# Patient Record
Sex: Male | Born: 1975 | Race: White | Hispanic: No | Marital: Married | Smoking: Never smoker
Health system: Southern US, Community
[De-identification: ages and names within clinical notes are randomized; demographics above are authoritative.]

## PROBLEM LIST (undated history)

## (undated) DIAGNOSIS — T8859XA Other complications of anesthesia, initial encounter: Secondary | ICD-10-CM

## (undated) DIAGNOSIS — G4733 Obstructive sleep apnea (adult) (pediatric): Secondary | ICD-10-CM

## (undated) DIAGNOSIS — E109 Type 1 diabetes mellitus without complications: Secondary | ICD-10-CM

## (undated) DIAGNOSIS — Z973 Presence of spectacles and contact lenses: Secondary | ICD-10-CM

## (undated) DIAGNOSIS — G4730 Sleep apnea, unspecified: Secondary | ICD-10-CM

## (undated) HISTORY — PX: CYST REMOVAL TRUNK: SHX6283

---

## 1998-05-02 HISTORY — PX: CYST REMOVAL TRUNK: SHX6283

## 2007-01-28 ENCOUNTER — Ambulatory Visit: Payer: Self-pay | Admitting: Family Medicine

## 2007-02-18 ENCOUNTER — Ambulatory Visit: Payer: Self-pay | Admitting: Family Medicine

## 2009-04-21 ENCOUNTER — Ambulatory Visit: Payer: Self-pay | Admitting: Ophthalmology

## 2011-04-01 ENCOUNTER — Ambulatory Visit: Payer: Self-pay | Admitting: Specialist

## 2011-11-21 ENCOUNTER — Ambulatory Visit: Payer: Self-pay | Admitting: Emergency Medicine

## 2011-11-22 DIAGNOSIS — G4733 Obstructive sleep apnea (adult) (pediatric): Secondary | ICD-10-CM | POA: Insufficient documentation

## 2013-03-13 DIAGNOSIS — N529 Male erectile dysfunction, unspecified: Secondary | ICD-10-CM | POA: Insufficient documentation

## 2014-09-26 ENCOUNTER — Other Ambulatory Visit: Payer: Self-pay | Admitting: Family Medicine

## 2014-09-26 DIAGNOSIS — E049 Nontoxic goiter, unspecified: Secondary | ICD-10-CM

## 2014-10-03 ENCOUNTER — Ambulatory Visit
Admission: RE | Admit: 2014-10-03 | Discharge: 2014-10-03 | Disposition: A | Discharge status: Home / Self Care | Payer: BLUE CROSS/BLUE SHIELD | Source: Ambulatory Visit | Attending: Family Medicine | Admitting: Family Medicine

## 2014-10-03 DIAGNOSIS — E041 Nontoxic single thyroid nodule: Secondary | ICD-10-CM | POA: Diagnosis not present

## 2014-10-03 DIAGNOSIS — E049 Nontoxic goiter, unspecified: Secondary | ICD-10-CM

## 2014-10-03 DIAGNOSIS — E010 Iodine-deficiency related diffuse (endemic) goiter: Secondary | ICD-10-CM | POA: Diagnosis present

## 2015-05-03 DIAGNOSIS — E041 Nontoxic single thyroid nodule: Secondary | ICD-10-CM | POA: Insufficient documentation

## 2016-03-30 ENCOUNTER — Other Ambulatory Visit: Payer: Self-pay | Admitting: Family Medicine

## 2016-03-30 DIAGNOSIS — E041 Nontoxic single thyroid nodule: Secondary | ICD-10-CM

## 2016-03-31 ENCOUNTER — Ambulatory Visit
Admission: RE | Admit: 2016-03-31 | Discharge: 2016-03-31 | Disposition: A | Discharge status: Home / Self Care | Payer: 59 | Source: Ambulatory Visit | Attending: Family Medicine | Admitting: Family Medicine

## 2016-03-31 DIAGNOSIS — E041 Nontoxic single thyroid nodule: Secondary | ICD-10-CM

## 2016-04-01 ENCOUNTER — Ambulatory Visit
Admission: EM | Admit: 2016-04-01 | Discharge: 2016-04-01 | Disposition: A | Discharge status: Home / Self Care | Payer: 59 | Attending: Family Medicine | Admitting: Family Medicine

## 2016-04-01 DIAGNOSIS — J069 Acute upper respiratory infection, unspecified: Secondary | ICD-10-CM | POA: Diagnosis not present

## 2016-04-01 HISTORY — DX: Type 1 diabetes mellitus without complications: E10.9

## 2016-04-01 MED ORDER — HYDROCOD POLST-CPM POLST ER 10-8 MG/5ML PO SUER: 5.0000 mL | Freq: Every evening | ORAL | 0 refills | Status: DC | PRN

## 2016-04-01 MED ORDER — BENZONATATE 100 MG PO CAPS: 100.0000 mg | ORAL_CAPSULE | Freq: Three times a day (TID) | ORAL | 0 refills | Status: DC | PRN

## 2016-04-01 NOTE — ED Triage Notes (Signed)
Patient complains of cough x 3 days. Patient states that he has also had some hoarseness. Patient states that symptoms worsened last night.

## 2016-04-01 NOTE — ED Provider Notes (Signed)
[facility] ____________________________________________  Time seen: Approximately 8:25 AM  I have reviewed the triage vital signs and the nursing notes.   HISTORY  Chief Complaint Cough   HPI Darius Morrison is a 40 y.o. male presents with complaints of 3 days of cough and congestion. Patient reports biggest complaint is a dry hacking cough. Patient reports some nasal congestion and chest congestion. States the cough is waking him up at night. Denies sore throat. Reports weight Wife and daughter recently at home with similar. Denies fevers. Reports continues to eat and drink well. Reports symptoms have been unresolved with over-the-counter Mucinex DM and Delsym. Denies chest pain, shortness of breath, abdominal pain, dysuria, fevers, dizziness, weakness. Denies recent sickness or recent antibiotic use. Reports type one diabetic, and reports sugars have been well controlled.  [name], Darius Ernesto, MD: PCP   Past Medical History:  Diagnosis Date  . Diabetes type 1, controlled (HCC)     There are no active problems to display for this patient.   Past Surgical History:  Procedure Laterality Date  . CYST REMOVAL TRUNK      Current Outpatient Rx  . Order #: [id number] Class: Historical Med  . Order #: [id number] Class: Historical Med  . Order #: [id number] Class: Historical Med  . Order #: [id number] Class: Historical Med  . Order #: [id number] Class: Normal  . Order #: [id number] Class: Print    No current facility-administered medications for this encounter.   Current Outpatient Prescriptions:  .  atorvastatin (LIPITOR) 10 MG tablet, Take 10 mg by mouth daily., Disp: , Rfl:  .  insulin glargine (LANTUS) 100 UNIT/ML injection, Inject 30 Units into the skin at bedtime., Disp: , Rfl:  .  insulin lispro (HUMALOG) 100 UNIT/ML injection, Inject 18 Units into the skin 3 (three) times daily with meals., Disp: , Rfl:  .  lisinopril (PRINIVIL,ZESTRIL) 5 MG tablet, Take 5 mg by  mouth daily., Disp: , Rfl:  .  benzonatate (TESSALON PERLES) 100 MG capsule, Take 1 capsule (100 mg total) by mouth 3 (three) times daily as needed., Disp: 15 capsule, Rfl: 0 .  chlorpheniramine-HYDROcodone (TUSSIONEX PENNKINETIC ER) 10-8 MG/5ML SUER, Take 5 mLs by mouth at bedtime as needed. do not drive or operate machinery while taking as can cause drowsiness., Disp: 100 mL, Rfl: 0  Allergies Patient has no known allergies.  History reviewed. No pertinent family history.  Social History Social History  Substance Use Topics  . Smoking status: Never Smoker  . Smokeless tobacco: Never Used  . Alcohol use Yes     Comment: rarely    Review of Systems Constitutional: No fever/chills Eyes: No visual changes. ENT: No sore throat. Cardiovascular: Denies chest pain. Respiratory: Denies shortness of breath. Gastrointestinal: No abdominal pain.  No nausea, no vomiting.  No diarrhea.  No constipation. Genitourinary: Negative for dysuria. Musculoskeletal: Negative for back pain. Skin: Negative for rash. Neurological: Negative for headaches, focal weakness or numbness.  10-point ROS otherwise negative.  ____________________________________________   PHYSICAL EXAM:  VITAL SIGNS: ED Triage Vitals  Enc Vitals Group     BP 04/01/16 0817 123/77     Pulse Rate 04/01/16 0817 87     Resp 04/01/16 0817 16     Temp 04/01/16 0817 98.2 F (36.8 C)     Temp Source 04/01/16 0817 Oral     SpO2 04/01/16 0817 98 %     Weight 04/01/16 0816 250 lb (113.4 kg)     Height 04/01/16 0816 6\' 1"  (1.854 m)  Head Circumference --      Peak Flow --      Pain Score 04/01/16 0818 1     Pain Loc --      Pain Edu? --      Excl. in GC? --    Constitutional: Alert and oriented. Well appearing and in no acute distress. Eyes: Conjunctivae are normal. PERRL. EOMI. Head: Atraumatic. No sinus tenderness to palpation. No swelling. No erythema.  Ears: no erythema, normal TMs bilaterally.   Nose:Nasal  congestion with clear rhinorrhea  Mouth/Throat: Mucous membranes are moist. Mild pharyngeal erythema. No tonsillar swelling or exudate.  Neck: No stridor.  No cervical spine tenderness to palpation. Hematological/Lymphatic/Immunilogical: No cervical lymphadenopathy. Cardiovascular: Normal rate, regular rhythm. Grossly normal heart sounds.  Good peripheral circulation. Respiratory: Normal respiratory effort.  No retractions. Lungs CTAB.No wheezes, rales or rhonchi. Good air movement.  Gastrointestinal: Soft and nontender.  Musculoskeletal:Ambulatory with steady gait. No cervical, thoracic or lumbar tenderness to palpation. Neurologic:  Normal speech and language. No gross focal neurologic deficits are appreciated. No gait instability. Skin:  Skin is warm, dry and intact. No rash noted. Psychiatric: Mood and affect are normal. Speech and behavior are normal.  ___________________________________________   LABS (all labs ordered are listed, but only abnormal results are displayed)  Labs Reviewed - No data to display ____________________________________________   PROCEDURES Procedures     INITIAL IMPRESSION / ASSESSMENT AND PLAN / ED COURSE  Pertinent labs & imaging results that were available during my care of the patient were reviewed by me and considered in my medical decision making (see chart for details).  Well-appearing patient. No acute distress. Suspect viral upper respiratory infection. Will treat supportively. Will treat with when necessary Tessalon Perles and when necessary Tussionex at night as needed for cough. Encouraged rest, fluids and PCP follow-up as needed.  Discussed follow up with Primary care physician this week. Discussed follow up and return parameters including no resolution or any worsening concerns. Patient verbalized understanding and agreed to plan.   ____________________________________________   FINAL CLINICAL IMPRESSION(S) / ED DIAGNOSES  Final  diagnoses:  Upper respiratory tract infection, unspecified type     Discharge Medication List as of 04/01/2016  8:32 AM    START taking these medications   Details  benzonatate (TESSALON PERLES) 100 MG capsule Take 1 capsule (100 mg total) by mouth 3 (three) times daily as needed., Starting Fri 04/01/2016, Normal    chlorpheniramine-HYDROcodone (TUSSIONEX PENNKINETIC ER) 10-8 MG/5ML SUER Take 5 mLs by mouth at bedtime as needed. do not drive or operate machinery while taking as can cause drowsiness., Starting Fri 04/01/2016, Print        Note: This dictation was prepared with Dragon dictation along with smaller phrase technology. Any transcriptional errors that result from this process are unintentional.    Clinical Course       Renford DillsLindsey Cynthya Yam, NP 04/01/16 [phone]

## 2016-04-01 NOTE — Discharge Instructions (Signed)
Take medication as prescribed. Rest. Drink plenty of fluids.  ° °Follow up with your primary care physician this week as needed. Return to Urgent care for new or worsening concerns.  ° °

## 2017-02-28 ENCOUNTER — Ambulatory Visit
Admission: EM | Admit: 2017-02-28 | Discharge: 2017-02-28 | Disposition: A | Discharge status: Home / Self Care | Payer: BLUE CROSS/BLUE SHIELD | Attending: Family Medicine | Admitting: Family Medicine

## 2017-02-28 DIAGNOSIS — S0502XA Injury of conjunctiva and corneal abrasion without foreign body, left eye, initial encounter: Secondary | ICD-10-CM

## 2017-02-28 MED ORDER — MOXIFLOXACIN HCL 0.5 % OP SOLN: 1.0000 [drp] | Freq: Three times a day (TID) | OPHTHALMIC | 0 refills | Status: DC

## 2017-02-28 NOTE — ED Triage Notes (Addendum)
Pt started with left eye redness and some irritation. Today was "matted shut". Pt reports he is just getting over a cold

## 2017-02-28 NOTE — ED Provider Notes (Signed)
[facility]    CSN: [id number] Arrival date & time: 02/28/17  0801     History   Chief Complaint Chief Complaint  Patient presents with  . Conjunctivitis    HPI Darius Morrison is a 41 y.o. male.   The history is provided by the patient.  Eye Problem  Location:  Left eye Quality:  Aching and foreign body sensation Severity:  Moderate Onset quality:  Sudden Duration:  1 day Timing:  Constant Progression:  Worsening Chronicity:  New Context: foreign body (states he feels he may have gotten some dirt or something in the eye yesterday )   Foreign body:  Dirt Relieved by:  Nothing Ineffective treatments:  Flushing Associated symptoms: discharge (mild, this morning), photophobia, redness and tearing   Associated symptoms: no blurred vision, no crusting, no decreased vision, no facial rash, no inflammation, no itching, no nausea, no numbness, no scotomas, no swelling, no tingling and no vomiting   Risk factors: no exposure to pinkeye and no previous injury to eye     Past Medical History:  Diagnosis Date  . Diabetes type 1, controlled (HCC)     There are no active problems to display for this patient.   Past Surgical History:  Procedure Laterality Date  . CYST REMOVAL TRUNK         Home Medications    Prior to Admission medications   Medication Sig Start Date End Date Taking? Authorizing Provider  insulin aspart (NOVOLOG) 100 UNIT/ML injection Inject into the skin 3 (three) times daily before meals.   Yes [provider]  atorvastatin (LIPITOR) 10 MG tablet Take 10 mg by mouth daily.    [provider]  benzonatate (TESSALON PERLES) 100 MG capsule Take 1 capsule (100 mg total) by mouth 3 (three) times daily as needed. 04/01/16   Renford DillsMiller, Lindsey, NP  chlorpheniramine-HYDROcodone [facility](TUSSIONEX PENNKINETIC ER) 10-8 MG/5ML SUER Take 5 mLs by mouth at bedtime as needed. do not drive or operate machinery while taking as can cause drowsiness.  04/01/16   Renford DillsMiller, Lindsey, NP  insulin glargine (LANTUS) 100 UNIT/ML injection Inject 30 Units into the skin at bedtime.    [provider]  insulin lispro (HUMALOG) 100 UNIT/ML injection Inject 18 Units into the skin 3 (three) times daily with meals.    [provider]  lisinopril (PRINIVIL,ZESTRIL) 5 MG tablet Take 5 mg by mouth daily.    [provider]  moxifloxacin (VIGAMOX) 0.5 % ophthalmic solution Place 1 drop into the left eye 3 (three) times daily. 02/28/17   Payton Mccallumonty, Suella Cogar, MD    Family History History reviewed. No pertinent family history.  Social History Social History  Substance Use Topics  . Smoking status: Never Smoker  . Smokeless tobacco: Never Used  . Alcohol use Yes     Comment: rarely     Allergies   Patient has no known allergies.   Review of Systems Review of Systems  Eyes: Positive for photophobia, discharge (mild, this morning) and redness. Negative for blurred vision and itching.  Gastrointestinal: Negative for nausea and vomiting.  Neurological: Negative for tingling and numbness.     Physical Exam Triage Vital Signs ED Triage Vitals  Enc Vitals Group     BP 02/28/17 0815 128/90     Pulse Rate 02/28/17 0815 74     Resp 02/28/17 0815 18     Temp 02/28/17 0815 98.1 F (36.7 C)     Temp Source 02/28/17 0815 Oral  SpO2 02/28/17 0815 99 %     Weight 02/28/17 0816 250 lb (113.4 kg)     Height 02/28/17 0816 6\' 1"  (1.854 m)     Head Circumference --      Peak Flow --      Pain Score 02/28/17 0817 3     Pain Loc --      Pain Edu? --      Excl. in GC? --    No data found.   Updated Vital Signs BP 128/90 (BP Location: Left Arm)   Pulse 74   Temp 98.1 F (36.7 C) (Oral)   Resp 18   Ht 6\' 1"  (1.854 m)   Wt 250 lb (113.4 kg)   SpO2 99%   BMI 32.98 kg/m   Visual Acuity Right Eye Distance:  (20/20 corrected) Left Eye Distance:  (20/15 corrected) Bilateral Distance:  (20/15 corrected)  Right Eye Near:     Left Eye Near:    Bilateral Near:     Physical Exam  Constitutional: He appears well-developed and well-nourished. No distress.  Eyes: Lids are normal. Lids are everted and swept, no foreign bodies found. Left conjunctiva is injected.  Fundoscopic exam:      The left eye shows no exudate and no hemorrhage.  Slit lamp exam:      The left eye shows corneal abrasion and fluorescein uptake. The left eye shows no foreign body.    Skin: He is not diaphoretic.  Nursing note and vitals reviewed.    UC Treatments / Results  Labs (all labs ordered are listed, but only abnormal results are displayed) Labs Reviewed - No data to display  EKG  EKG Interpretation None       Radiology No results found.  Procedures Procedures (including critical care time)  Medications Ordered in UC Medications - No data to display   Initial Impression / Assessment and Plan / UC Course  I have reviewed the triage vital signs and the nursing notes.  Pertinent labs & imaging results that were available during my care of the patient were reviewed by me and considered in my medical decision making (see chart for details).       Final Clinical Impressions(s) / UC Diagnoses   Final diagnoses:  Abrasion of left cornea, initial encounter    New Prescriptions Discharge Medication List as of 02/28/2017  8:32 AM    START taking these medications   Details  moxifloxacin (VIGAMOX) 0.5 % ophthalmic solution Place 1 drop into the left eye 3 (three) times daily., Starting Tue 02/28/2017, Normal       1. diagnosis reviewed with patient 2. rx as per orders above; reviewed possible side effects, interactions, risks and benefits  3. Recommend supportive treatment with cool compresses prn; otc analgesics prn; don't wear contact lenses until resolved 4. Follow-up prn if symptoms worsen or don't improve Controlled Substance Prescriptions [place] Controlled Substance Registry consulted? Not Applicable    Payton Mccallum, MD 02/28/17 [phone]

## 2017-03-18 ENCOUNTER — Other Ambulatory Visit: Payer: Self-pay

## 2017-03-18 ENCOUNTER — Encounter: Payer: Self-pay | Admitting: Emergency Medicine

## 2017-03-18 ENCOUNTER — Ambulatory Visit
Admission: EM | Admit: 2017-03-18 | Discharge: 2017-03-18 | Disposition: A | Discharge status: Home / Self Care | Payer: BLUE CROSS/BLUE SHIELD | Attending: Emergency Medicine | Admitting: Emergency Medicine

## 2017-03-18 ENCOUNTER — Ambulatory Visit (INDEPENDENT_AMBULATORY_CARE_PROVIDER_SITE_OTHER): Payer: BLUE CROSS/BLUE SHIELD

## 2017-03-18 DIAGNOSIS — J019 Acute sinusitis, unspecified: Secondary | ICD-10-CM | POA: Diagnosis not present

## 2017-03-18 DIAGNOSIS — R059 Cough, unspecified: Secondary | ICD-10-CM

## 2017-03-18 DIAGNOSIS — R05 Cough: Secondary | ICD-10-CM

## 2017-03-18 DIAGNOSIS — R0602 Shortness of breath: Secondary | ICD-10-CM

## 2017-03-18 DIAGNOSIS — R062 Wheezing: Secondary | ICD-10-CM

## 2017-03-18 DIAGNOSIS — R0600 Dyspnea, unspecified: Secondary | ICD-10-CM

## 2017-03-18 MED ORDER — BENZONATATE 100 MG PO CAPS: 100.0000 mg | ORAL_CAPSULE | Freq: Three times a day (TID) | ORAL | 0 refills | Status: DC | PRN

## 2017-03-18 MED ORDER — AEROCHAMBER PLUS MISC: 2 refills | Status: DC

## 2017-03-18 MED ORDER — DOXYCYCLINE HYCLATE 100 MG PO CAPS: 100.0000 mg | ORAL_CAPSULE | Freq: Two times a day (BID) | ORAL | 0 refills | Status: AC

## 2017-03-18 MED ORDER — ALBUTEROL SULFATE HFA 108 (90 BASE) MCG/ACT IN AERS: 1.0000 | INHALATION_SPRAY | Freq: Four times a day (QID) | RESPIRATORY_TRACT | 0 refills | Status: DC | PRN

## 2017-03-18 MED ORDER — FLUTICASONE PROPIONATE 50 MCG/ACT NA SUSP: 2.0000 | Freq: Every day | NASAL | 0 refills | Status: DC

## 2017-03-18 MED ORDER — IPRATROPIUM-ALBUTEROL 0.5-2.5 (3) MG/3ML IN SOLN
3.0000 mL | Freq: Once | RESPIRATORY_TRACT | Status: AC
Start: 2017-03-18 — End: 2017-03-18
  Administered 2017-03-18: 3 mL via RESPIRATORY_TRACT

## 2017-03-18 NOTE — Discharge Instructions (Signed)
2 puffs from your albuterol inhaler every 4- 6 hours.  Try some Claritin, Zyrtec, or Allegra.  Start the Flonase and saline nasal irrigation.

## 2017-03-18 NOTE — ED Provider Notes (Signed)
HPI  SUBJECTIVE:  Darius Morrison is a 41 y.o. male who presents with "bronchitis" for the past month.  He wants to rule out pneumonia today.  States that he got better for a day or 2 and then got worse again.  He reports nasal congestion, rhinorrhea, postnasal drip, head and chest congestion, sinus pressure, dry cough, wheezing, shortness of breath described as "breathing through a wet sock".  He states that his coughing is worse in the morning with lying down, and he reports sneezing and itchy eyes.  Tried Mucinex and DayQuil, Tylenol, ibuprofen.  Mucinex helps.  No aggravating factors.  No sinus pain, upper dental pain, fevers, GERD symptoms.  No chest pain.  No antibiotic in the past month.  No antipyretic in the past 6-8 hours.  No lower extremity edema, dyspnea on exertion, nocturia, abdominal pain, weight gain, PND, orthopnea.  He has a past medical history of OSA on CPAP, diabetes type 1.  He is on lisinopril to protect his kidneys does not have hypertension.  No history of known allergies, sinusitis, asthma, eczema, COPD, smoking, CHF, pneumonia.  PMD: [name], Mario Ernesto, MD    Past Medical History:  Diagnosis Date  . Diabetes type 1, controlled (HCC)     Past Surgical History:  Procedure Laterality Date  . CYST REMOVAL TRUNK      Family History  Problem Relation Age of Onset  . Thyroid disease Mother     Social History   Tobacco Use  . Smoking status: Never Smoker  . Smokeless tobacco: Never Used  Substance Use Topics  . Alcohol use: Yes    Comment: rarely  . Drug use: No    No current facility-administered medications for this encounter.   Current Outpatient Medications:  .  albuterol (PROVENTIL HFA;VENTOLIN HFA) 108 (90 Base) MCG/ACT inhaler, Inhale 1-2 puffs every 6 (six) hours as needed into the lungs for wheezing or shortness of breath., Disp: 1 Inhaler, Rfl: 0 .  atorvastatin (LIPITOR) 10 MG tablet, Take 10 mg by mouth daily., Disp: , Rfl:  .  benzonatate  (TESSALON PERLES) 100 MG capsule, Take 1 capsule (100 mg total) 3 (three) times daily as needed by mouth., Disp: 30 capsule, Rfl: 0 .  doxycycline (VIBRAMYCIN) 100 MG capsule, Take 1 capsule (100 mg total) 2 (two) times daily for 7 days by mouth., Disp: 14 capsule, Rfl: 0 .  fluticasone (FLONASE) 50 MCG/ACT nasal spray, Place 2 sprays daily into both nostrils., Disp: 16 g, Rfl: 0 .  insulin aspart (NOVOLOG) 100 UNIT/ML injection, Inject into the skin 3 (three) times daily before meals., Disp: , Rfl:  .  insulin glargine (LANTUS) 100 UNIT/ML injection, Inject 30 Units into the skin at bedtime., Disp: , Rfl:  .  insulin lispro (HUMALOG) 100 UNIT/ML injection, Inject 18 Units into the skin 3 (three) times daily with meals., Disp: , Rfl:  .  lisinopril (PRINIVIL,ZESTRIL) 5 MG tablet, Take 5 mg by mouth daily., Disp: , Rfl:  .  moxifloxacin (VIGAMOX) 0.5 % ophthalmic solution, Place 1 drop into the left eye 3 (three) times daily., Disp: 3 mL, Rfl: 0 .  Spacer/Aero-Holding Chambers (AEROCHAMBER PLUS) inhaler, Use as instructed, Disp: 1 each, Rfl: 2  No Known Allergies   ROS  As noted in HPI.   Physical Exam  BP 121/82 (BP Location: Left Arm)   Pulse 100   Temp 98.6 F (37 C) (Oral)   Resp 16   Ht 6\' 1"  (1.854 m)   Wt  258 lb 6.4 oz (117.2 kg)   SpO2 99%   BMI 34.09 kg/m   Constitutional: Well developed, well nourished, no acute distress Eyes:  EOMI, conjunctiva normal bilaterally HENT: Normocephalic, atraumatic,mucus membranes moist and positive clear rhinorrhea.  Erythematous, swollen turbinates.  No sinus tenderness.  No postnasal drip or cobblestoning. Respiratory: Normal inspiratory effort clear bilaterally.  No chest wall tenderness  cardiovascular: Normal rate regular rhythm no murmur or gallops  GI: nondistended skin: No rash, skin intact Musculoskeletal: no deformities, calves symmetric, nontender, no edema neurologic: Alert & oriented x 3, no focal neuro deficits Psychiatric:  Speech and behavior appropriate   ED Course   Medications  ipratropium-albuterol (DUONEB) 0.5-2.5 (3) MG/3ML nebulizer solution 3 mL (3 mLs Nebulization Given 03/18/17 0830)    Orders Placed This Encounter  Procedures  . DG Chest 2 View    Standing Status:   Standing    Number of Occurrences:   1    Order Specific Question:   Reason for Exam (SYMPTOM  OR DIAGNOSIS REQUIRED)    Answer:   COUGH X  MONTH R/O pna pulm edema effusion mass    No results found for this or any previous visit (from the past 24 hour(s)). Dg Chest 2 View  Result Date: 03/18/2017 CLINICAL DATA:  Difficulty breathing, cough, congestion EXAM: CHEST  2 VIEW COMPARISON:  None. FINDINGS: Heart and mediastinal contours are within normal limits. No focal opacities or effusions. No acute bony abnormality. IMPRESSION: No active cardiopulmonary disease. Electronically Signed   By: Charlett Nose M.D.   On: 03/18/2017 09:10    ED Clinical Impression  Cough  Acute non-recurrent sinusitis, unspecified location   ED Assessment/Plan  We will give DuoNeb for possible bronchospasm, check chest x-ray given duration of symptoms.  Will reevaluate.  Reviewed imaging independently.  Normal chest x-ray.  See radiology report for full details. On reexamination, patient states that he feels better.  States his breathing has improved.  Lungs still clear on repeat exam.  Presentation consistent with cough from postnasal drip some bronchospasm.  Think that this is related to allergies given duration of symptoms and the itchy, watery eyes.  He does report sinus pain and pressure but he has no sinus tenderness.  Will try Tessalon, albuterol with a spacer, Flonase, and antihistamine of his choice such as Zyrtec, and if he does not get better with these, a wait-and-see prescription of doxycycline.  Follow up with PMD as needed, to the ER if he gets worse. Discussed imaging, MDM, plan and followup with patient. Discussed sn/sx that should  prompt return to the ED. patient agrees with plan.   Meds ordered this encounter  Medications  . ipratropium-albuterol (DUONEB) 0.5-2.5 (3) MG/3ML nebulizer solution 3 mL  . benzonatate (TESSALON PERLES) 100 MG capsule    Sig: Take 1 capsule (100 mg total) 3 (three) times daily as needed by mouth.    Dispense:  30 capsule    Refill:  0  . albuterol (PROVENTIL HFA;VENTOLIN HFA) 108 (90 Base) MCG/ACT inhaler    Sig: Inhale 1-2 puffs every 6 (six) hours as needed into the lungs for wheezing or shortness of breath.    Dispense:  1 Inhaler    Refill:  0  . Spacer/Aero-Holding Chambers (AEROCHAMBER PLUS) inhaler    Sig: Use as instructed    Dispense:  1 each    Refill:  2  . fluticasone (FLONASE) 50 MCG/ACT nasal spray    Sig: Place 2 sprays daily into both  nostrils.    Dispense:  16 g    Refill:  0  . doxycycline (VIBRAMYCIN) 100 MG capsule    Sig: Take 1 capsule (100 mg total) 2 (two) times daily for 7 days by mouth.    Dispense:  14 capsule    Refill:  0    *This clinic note was created using Scientist, clinical (histocompatibility and immunogenetics)Dragon dictation software. Therefore, there may be occasional mistakes despite careful proofreading.   ?    Domenick GongMortenson, Pamela Intrieri, MD 03/18/17 1310

## 2017-03-18 NOTE — ED Triage Notes (Signed)
Patient c/o cough and chest congestion off and on for a month.  Patient denies fevers.  Patient also reports runny nose.

## 2017-09-26 DIAGNOSIS — E785 Hyperlipidemia, unspecified: Secondary | ICD-10-CM | POA: Insufficient documentation

## 2017-09-26 DIAGNOSIS — E109 Type 1 diabetes mellitus without complications: Secondary | ICD-10-CM | POA: Insufficient documentation

## 2018-01-04 DIAGNOSIS — E785 Hyperlipidemia, unspecified: Secondary | ICD-10-CM | POA: Diagnosis not present

## 2018-01-04 DIAGNOSIS — E109 Type 1 diabetes mellitus without complications: Secondary | ICD-10-CM | POA: Diagnosis not present

## 2018-01-04 DIAGNOSIS — N528 Other male erectile dysfunction: Secondary | ICD-10-CM | POA: Diagnosis not present

## 2018-01-04 DIAGNOSIS — E875 Hyperkalemia: Secondary | ICD-10-CM | POA: Diagnosis not present

## 2018-02-09 DIAGNOSIS — Z23 Encounter for immunization: Secondary | ICD-10-CM | POA: Diagnosis not present

## 2018-03-28 DIAGNOSIS — M9901 Segmental and somatic dysfunction of cervical region: Secondary | ICD-10-CM | POA: Diagnosis not present

## 2018-03-28 DIAGNOSIS — M9902 Segmental and somatic dysfunction of thoracic region: Secondary | ICD-10-CM | POA: Diagnosis not present

## 2018-03-28 DIAGNOSIS — M546 Pain in thoracic spine: Secondary | ICD-10-CM | POA: Diagnosis not present

## 2018-03-28 DIAGNOSIS — M530 Cervicocranial syndrome: Secondary | ICD-10-CM | POA: Diagnosis not present

## 2018-04-03 DIAGNOSIS — M9902 Segmental and somatic dysfunction of thoracic region: Secondary | ICD-10-CM | POA: Diagnosis not present

## 2018-04-03 DIAGNOSIS — M546 Pain in thoracic spine: Secondary | ICD-10-CM | POA: Diagnosis not present

## 2018-04-03 DIAGNOSIS — M530 Cervicocranial syndrome: Secondary | ICD-10-CM | POA: Diagnosis not present

## 2018-04-03 DIAGNOSIS — M9901 Segmental and somatic dysfunction of cervical region: Secondary | ICD-10-CM | POA: Diagnosis not present

## 2018-04-05 DIAGNOSIS — M9902 Segmental and somatic dysfunction of thoracic region: Secondary | ICD-10-CM | POA: Diagnosis not present

## 2018-04-05 DIAGNOSIS — M546 Pain in thoracic spine: Secondary | ICD-10-CM | POA: Diagnosis not present

## 2018-04-05 DIAGNOSIS — E785 Hyperlipidemia, unspecified: Secondary | ICD-10-CM | POA: Diagnosis not present

## 2018-04-05 DIAGNOSIS — E109 Type 1 diabetes mellitus without complications: Secondary | ICD-10-CM | POA: Diagnosis not present

## 2018-04-05 DIAGNOSIS — M9901 Segmental and somatic dysfunction of cervical region: Secondary | ICD-10-CM | POA: Diagnosis not present

## 2018-04-05 DIAGNOSIS — M530 Cervicocranial syndrome: Secondary | ICD-10-CM | POA: Diagnosis not present

## 2018-04-10 DIAGNOSIS — M9901 Segmental and somatic dysfunction of cervical region: Secondary | ICD-10-CM | POA: Diagnosis not present

## 2018-04-10 DIAGNOSIS — M546 Pain in thoracic spine: Secondary | ICD-10-CM | POA: Diagnosis not present

## 2018-04-10 DIAGNOSIS — M9902 Segmental and somatic dysfunction of thoracic region: Secondary | ICD-10-CM | POA: Diagnosis not present

## 2018-04-10 DIAGNOSIS — M530 Cervicocranial syndrome: Secondary | ICD-10-CM | POA: Diagnosis not present

## 2018-04-12 DIAGNOSIS — M546 Pain in thoracic spine: Secondary | ICD-10-CM | POA: Diagnosis not present

## 2018-04-12 DIAGNOSIS — M530 Cervicocranial syndrome: Secondary | ICD-10-CM | POA: Diagnosis not present

## 2018-04-12 DIAGNOSIS — M9902 Segmental and somatic dysfunction of thoracic region: Secondary | ICD-10-CM | POA: Diagnosis not present

## 2018-04-12 DIAGNOSIS — M9901 Segmental and somatic dysfunction of cervical region: Secondary | ICD-10-CM | POA: Diagnosis not present

## 2018-05-22 DIAGNOSIS — M9901 Segmental and somatic dysfunction of cervical region: Secondary | ICD-10-CM | POA: Diagnosis not present

## 2018-05-22 DIAGNOSIS — M9902 Segmental and somatic dysfunction of thoracic region: Secondary | ICD-10-CM | POA: Diagnosis not present

## 2018-05-22 DIAGNOSIS — M546 Pain in thoracic spine: Secondary | ICD-10-CM | POA: Diagnosis not present

## 2018-05-22 DIAGNOSIS — M530 Cervicocranial syndrome: Secondary | ICD-10-CM | POA: Diagnosis not present

## 2018-06-26 DIAGNOSIS — M9901 Segmental and somatic dysfunction of cervical region: Secondary | ICD-10-CM | POA: Diagnosis not present

## 2018-06-26 DIAGNOSIS — M530 Cervicocranial syndrome: Secondary | ICD-10-CM | POA: Diagnosis not present

## 2018-06-26 DIAGNOSIS — M546 Pain in thoracic spine: Secondary | ICD-10-CM | POA: Diagnosis not present

## 2018-06-26 DIAGNOSIS — M9902 Segmental and somatic dysfunction of thoracic region: Secondary | ICD-10-CM | POA: Diagnosis not present

## 2018-07-05 DIAGNOSIS — E875 Hyperkalemia: Secondary | ICD-10-CM | POA: Diagnosis not present

## 2018-07-05 DIAGNOSIS — G4733 Obstructive sleep apnea (adult) (pediatric): Secondary | ICD-10-CM | POA: Diagnosis not present

## 2018-07-05 DIAGNOSIS — E109 Type 1 diabetes mellitus without complications: Secondary | ICD-10-CM | POA: Diagnosis not present

## 2018-07-05 DIAGNOSIS — E785 Hyperlipidemia, unspecified: Secondary | ICD-10-CM | POA: Diagnosis not present

## 2018-10-10 DIAGNOSIS — E109 Type 1 diabetes mellitus without complications: Secondary | ICD-10-CM | POA: Diagnosis not present

## 2018-10-10 DIAGNOSIS — E785 Hyperlipidemia, unspecified: Secondary | ICD-10-CM | POA: Diagnosis not present

## 2018-10-10 DIAGNOSIS — Z20828 Contact with and (suspected) exposure to other viral communicable diseases: Secondary | ICD-10-CM | POA: Diagnosis not present

## 2018-10-10 LAB — LIPID PANEL
Cholesterol: 138 (ref 0–200)
HDL: 58 (ref 35–70)
LDL Cholesterol: 63
Triglycerides: 86 (ref 40–160)

## 2018-10-10 LAB — HEMOGLOBIN A1C: Hemoglobin A1C: 7.2

## 2018-10-10 LAB — BASIC METABOLIC PANEL
BUN: 10 (ref 4–21)
Creatinine: 1.3 (ref 0.6–1.3)

## 2018-12-11 DIAGNOSIS — J069 Acute upper respiratory infection, unspecified: Secondary | ICD-10-CM | POA: Diagnosis not present

## 2018-12-11 DIAGNOSIS — Z20828 Contact with and (suspected) exposure to other viral communicable diseases: Secondary | ICD-10-CM | POA: Diagnosis not present

## 2019-01-06 ENCOUNTER — Encounter: Payer: Self-pay | Admitting: Internal Medicine

## 2019-01-09 ENCOUNTER — Ambulatory Visit: Payer: BC Managed Care – PPO | Admitting: Internal Medicine

## 2019-01-09 ENCOUNTER — Encounter: Payer: Self-pay | Admitting: Internal Medicine

## 2019-01-09 ENCOUNTER — Other Ambulatory Visit: Payer: Self-pay

## 2019-01-09 VITALS — BP 128/70 | HR 68 | Ht 73.0 in | Wt 262.0 lb

## 2019-01-09 DIAGNOSIS — E109 Type 1 diabetes mellitus without complications: Secondary | ICD-10-CM | POA: Diagnosis not present

## 2019-01-09 DIAGNOSIS — E785 Hyperlipidemia, unspecified: Secondary | ICD-10-CM | POA: Diagnosis not present

## 2019-01-09 DIAGNOSIS — L247 Irritant contact dermatitis due to plants, except food: Secondary | ICD-10-CM | POA: Diagnosis not present

## 2019-01-09 DIAGNOSIS — E041 Nontoxic single thyroid nodule: Secondary | ICD-10-CM

## 2019-01-09 MED ORDER — TRIAMCINOLONE ACETONIDE 0.5 % EX CREA: 1.0000 "application " | TOPICAL_CREAM | Freq: Three times a day (TID) | CUTANEOUS | 1 refills | Status: DC

## 2019-01-09 NOTE — Progress Notes (Signed)
Date:  01/09/2019   Name:  Darius Morrison   DOB:  11/09/1975   MRN:  [id number]   Chief Complaint: Establish Care and Poison Ivy (Right forearm, and right abdomen. X 1 week. Using OTC cortisporin and not helping. )  Diabetes He presents for his follow-up diabetic visit. He has type 1 diabetes mellitus. His disease course has been stable. There are no hypoglycemic associated symptoms. Pertinent negatives for hypoglycemia include no dizziness, headaches, nervousness/anxiousness or tremors. Pertinent negatives for diabetes include no chest pain, no fatigue, no polydipsia and no polyuria. Symptoms are stable. Current diabetic treatment includes intensive insulin program. He is compliant with treatment all of the time. His weight is stable. He monitors blood glucose at home 3-4 x per day. There is no change in his home blood glucose trend. An ACE inhibitor/angiotensin II receptor blocker is being taken.  Hyperlipidemia Pertinent negatives include no chest pain or shortness of breath.  Rash This is a new problem. The current episode started in the past 7 days. The problem is unchanged. The affected locations include the right arm and abdomen. The rash is characterized by blistering and itchiness. He was exposed to plant contact. Pertinent negatives include no cough, fatigue, shortness of breath or sore throat.  Thyroid nodule - this was found about 4 years ago on exam.  Pt denies any symptoms of compression, dysphagia, change in weight.  His last [place] was in 2017. IMPRESSION: 9 mm hypoechoic solid right upper pole thyroid nodule (TR 5), this nodule meets criteria for follow-up in 1 year.  Lab Results  Component Value Date   HGBA1C 7.2 10/10/2018   Lab Results  Component Value Date   CHOL 138 10/10/2018   HDL 58 10/10/2018   LDLCALC 63 10/10/2018   TRIG 86 10/10/2018   Lab Results  Component Value Date   CREATININE 1.3 10/10/2018   BUN 10 10/10/2018     Review of Systems   Constitutional: Negative for appetite change, chills, fatigue and unexpected weight change.  HENT: Negative for sore throat and trouble swallowing.   Eyes: Negative for visual disturbance.  Respiratory: Negative for cough, choking, shortness of breath and wheezing.   Cardiovascular: Negative for chest pain, palpitations and leg swelling.  Gastrointestinal: Negative for abdominal pain and blood in stool.  Endocrine: Negative for polydipsia and polyuria.  Genitourinary: Negative for dysuria and hematuria.  Skin: Positive for rash. Negative for color change.  Allergic/Immunologic: Negative for environmental allergies.  Neurological: Negative for dizziness, tremors, numbness and headaches.  Psychiatric/Behavioral: Negative for dysphoric mood and sleep disturbance. The patient is not nervous/anxious.     Patient Active Problem List   Diagnosis Date Noted  . Type I diabetes mellitus, well controlled ([place]) 09/26/2017  . Hyperlipidemia LDL goal <100 09/26/2017  . Thyroid nodule 2017  . ED (erectile dysfunction) 03/13/2013  . OSA on CPAP 11/22/2011    No Known Allergies  Past Surgical History:  Procedure Laterality Date  . CYST REMOVAL TRUNK  2000    Social History   Tobacco Use  . Smoking status: Never Smoker  . Smokeless tobacco: Never Used  Substance Use Topics  . Alcohol use: Yes    Comment: rarely  . Drug use: No     Medication list has been reviewed and updated.  Current Meds  Medication Sig  . atorvastatin (LIPITOR) 10 MG tablet Take 10 mg by mouth daily.  . Continuous Blood Gluc Sensor (FREESTYLE LIBRE 14 DAY SENSOR) MISC by Does  not apply route.  . insulin aspart (NOVOLOG) 100 UNIT/ML injection Inject 20 Units into the skin 3 (three) times daily before meals.   . insulin glargine (LANTUS) 100 UNIT/ML injection Inject 36 Units into the skin at bedtime.   Marland Kitchen. lisinopril (PRINIVIL,ZESTRIL) 5 MG tablet Take 5 mg by mouth daily.  . sildenafil (REVATIO) 20 MG tablet Take  20 mg by mouth 3 (three) times daily. [facility] [place]    PHQ 2/9 Scores 01/09/2019  PHQ - 2 Score 0    BP Readings from Last 3 Encounters:  01/09/19 128/70  03/18/17 121/82  02/28/17 128/90    Physical Exam Vitals signs and nursing note reviewed.  Constitutional:      General: He is not in acute distress.    Appearance: Normal appearance. He is well-developed.  HENT:     Head: Normocephalic and atraumatic.  Eyes:     Pupils: Pupils are equal, round, and reactive to light.  Neck:     Musculoskeletal: Normal range of motion.     Thyroid: No thyroid mass, thyromegaly or thyroid tenderness.  Cardiovascular:     Rate and Rhythm: Normal rate and regular rhythm.     Pulses: Normal pulses.     Heart sounds: No murmur.  Pulmonary:     Effort: Pulmonary effort is normal. No respiratory distress.     Breath sounds: No wheezing or rhonchi.  Musculoskeletal: Normal range of motion.     Right lower leg: No edema.     Left lower leg: No edema.  Skin:    General: Skin is warm and dry.     Capillary Refill: Capillary refill takes less than 2 seconds.     Findings: Rash (on inner right forearm and right side of abdomen) present.  Neurological:     General: No focal deficit present.     Mental Status: He is alert and oriented to person, place, and time.  Psychiatric:        Behavior: Behavior normal.        Thought Content: Thought content normal.     Wt Readings from Last 3 Encounters:  01/09/19 262 lb (118.8 kg)  03/18/17 258 lb 6.4 oz (117.2 kg)  02/28/17 250 lb (113.4 kg)    BP 128/70   Pulse 68   Ht 6\' 1"  (1.854 m)   Wt 262 lb (118.8 kg)   SpO2 97%   BMI 34.57 kg/m   Assessment and Plan: 1. Type I diabetes mellitus, well controlled ([facility]) Clinically stable by exam and report without s/s of hypoglycemia. DM complicated by hyperlipidemia. Tolerating medications - intensive insulin therapy -  well without side effects or other concerns. Continue Lisinopril  for renal protection and atorvastatin for lipids - Hemoglobin A1c  2. Thyroid nodule Last US was in 2017 and was unchanged from the year before No nodule is palpable on exam today - US THYROID; Future  3. Hyperlipidemia LDL goal <100 Tolerating statin medication without side effects at this time LDL is at goal of < 70 on current dose Continue same therapy without change at this time.  4. Irritant contact dermatitis due to plants, except food Take Benadryl at HS and zyrtec during the day - triamcinolone cream (KENALOG) 0.5 %; Apply 1 application topically 3 (three) times daily.  Dispense: 30 g; Refill: 1   Partially dictated using Animal nutritionistDragon software. Any errors are unintentional.  Bari EdwardLaura Rande Roylance, MD [facility]  01/09/2019

## 2019-01-10 LAB — HEMOGLOBIN A1C
Est. average glucose Bld gHb Est-mCnc: 160 mg/dL
Hgb A1c MFr Bld: 7.2 % — ABNORMAL HIGH (ref 4.8–5.6)

## 2019-01-14 ENCOUNTER — Encounter: Payer: Self-pay | Admitting: Emergency Medicine

## 2019-01-14 ENCOUNTER — Other Ambulatory Visit: Payer: Self-pay

## 2019-01-14 ENCOUNTER — Ambulatory Visit
Admission: EM | Admit: 2019-01-14 | Discharge: 2019-01-14 | Disposition: A | Discharge status: Home / Self Care | Payer: BC Managed Care – PPO | Attending: Family Medicine | Admitting: Family Medicine

## 2019-01-14 DIAGNOSIS — L237 Allergic contact dermatitis due to plants, except food: Secondary | ICD-10-CM | POA: Diagnosis not present

## 2019-01-14 DIAGNOSIS — L509 Urticaria, unspecified: Secondary | ICD-10-CM

## 2019-01-14 NOTE — ED Triage Notes (Signed)
Pt c/o rash on his arm, chest, and back. First started on the arm about a week ago then spread to his chest and abdomen about 5 days ago then this morning he noticed his back was covered. He was seen by his PCP last week and given triamcinolone cream.

## 2019-01-14 NOTE — ED Provider Notes (Signed)
[facility]    CSN: [id number] Arrival date & time: 01/14/19  1504      History   Chief Complaint Chief Complaint  Patient presents with  . Rash    HPI Darius Morrison is a 43 y.o. male.   43 yo male with a recent contact dermatitis due to poison oak on his right forearm and right abdominal skin, presents with a c/o new rash on chest, back, abdomen for the past 4-5 days. States rash is itchy, not painful. Denies any drainage, fevers, chills. Saw his PCP last week for contact dermatitis rash and prescribed triamcinolone. States this rash is better. Denies any swelling, shortness of breath, wheezing, chest pain.    Rash   Past Medical History:  Diagnosis Date  . Diabetes type 1, controlled [facility])     Patient Active Problem List   Diagnosis Date Noted  . DM (diabetes mellitus), type 1 ([place]) 09/26/2017  . Hyperlipidemia LDL goal <100 09/26/2017  . Thyroid nodule 2017  . ED (erectile dysfunction) 03/13/2013  . OSA on CPAP 11/22/2011    Past Surgical History:  Procedure Laterality Date  . CYST REMOVAL TRUNK  2000       Home Medications    Prior to Admission medications   Medication Sig Start Date End Date Taking? Authorizing Provider  aspirin EC 81 MG tablet Take 81 mg by mouth daily.   Yes [provider]  atorvastatin (LIPITOR) 10 MG tablet Take 10 mg by mouth daily.   Yes [provider]  insulin aspart (NOVOLOG) 100 UNIT/ML injection Inject 20 Units into the skin 3 (three) times daily before meals.    Yes [provider]  insulin glargine (LANTUS) 100 UNIT/ML injection Inject 36 Units into the skin at bedtime.    Yes [provider]  lisinopril (PRINIVIL,ZESTRIL) 5 MG tablet Take 5 mg by mouth daily.   Yes [provider]  sildenafil (REVATIO) 20 MG tablet Take 20 mg by mouth 3 (three) times daily. [facility] [place]   Yes [provider]  triamcinolone cream (KENALOG) 0.5 % Apply 1  application topically 3 (three) times daily. 01/09/19  Yes Glean Hess, MD  Continuous Blood Gluc Sensor (FREESTYLE LIBRE 14 DAY SENSOR) MISC by Does not apply route.    [provider]    Family History Family History  Problem Relation Age of Onset  . Thyroid disease Mother   . Colon cancer Paternal Uncle   . Diabetes Maternal Grandmother   . Heart attack Maternal Grandfather   . Heart disease Maternal Grandfather   . Dementia Paternal Grandmother   . Dementia Paternal Grandfather   . Heart attack Paternal Grandfather   . Heart disease Paternal Grandfather     Social History Social History   Tobacco Use  . Smoking status: Never Smoker  . Smokeless tobacco: Never Used  Substance Use Topics  . Alcohol use: Yes    Comment: rarely  . Drug use: No     Allergies   Patient has no known allergies.   Review of Systems Review of Systems  Skin: Positive for rash.     Physical Exam Triage Vital Signs ED Triage Vitals  Enc Vitals Group     BP 01/14/19 1546 109/77     Pulse Rate 01/14/19 1546 78     Resp 01/14/19 1546 18     Temp 01/14/19 1546 98.4 F (36.9 C)     Temp Source 01/14/19 1546 Oral  SpO2 01/14/19 1546 99 %     Weight 01/14/19 1543 255 lb (115.7 kg)     Height 01/14/19 1543 6\' 1"  (1.854 m)     Head Circumference --      Peak Flow --      Pain Score 01/14/19 1543 0     Pain Loc --      Pain Edu? --      Excl. in GC? --    No data found.  Updated Vital Signs BP 109/77 (BP Location: Left Arm)   Pulse 78   Temp 98.4 F (36.9 C) (Oral)   Resp 18   Ht 6\' 1"  (1.854 m)   Wt 115.7 kg   SpO2 99%   BMI 33.64 kg/m   Visual Acuity Right Eye Distance:   Left Eye Distance:   Bilateral Distance:    Right Eye Near:   Left Eye Near:    Bilateral Near:     Physical Exam Vitals signs and nursing note reviewed.  Constitutional:      General: He is not in acute distress.    Appearance: Normal appearance. He is not ill-appearing,  toxic-appearing or diaphoretic.  Pulmonary:     Effort: Pulmonary effort is normal. No respiratory distress.  Skin:    Findings: Rash present. Rash is crusting (on right forearm and right abdomen) and urticarial (on back and trunk).  Neurological:     Mental Status: He is alert.      UC Treatments / Results  Labs (all labs ordered are listed, but only abnormal results are displayed) Labs Reviewed - No data to display  EKG   Radiology No results found.  Procedures Procedures (including critical care time)  Medications Ordered in UC Medications - No data to display  Initial Impression / Assessment and Plan / UC Course  I have reviewed the triage vital signs and the nursing notes.  Pertinent labs & imaging results that were available during my care of the patient were reviewed by me and considered in my medical decision making (see chart for details).      Final Clinical Impressions(s) / UC Diagnoses   Final diagnoses:  Contact dermatitis due to poison oak  Hives     Discharge Instructions     Add over the counter Pepcid one a day Continue other current antihistamines    ED Prescriptions    None      1. diagnosis reviewed with patient 2. Discussed adding oral prednisone treatment, however patient declines stating he's diabetic and doesn't want to affect his blood sugars 3. Recommend supportive treatment as above  4. Follow-up prn if symptoms worsen or don't improve   Controlled Substance Prescriptions [place] Controlled Substance Registry consulted? Not Applicable   Payton Mccallumonty, Tempest Frankland, MD 01/14/19 1836

## 2019-01-14 NOTE — Discharge Instructions (Signed)
Add over the counter Pepcid one a day Continue other current antihistamines

## 2019-01-15 ENCOUNTER — Ambulatory Visit: Payer: BLUE CROSS/BLUE SHIELD

## 2019-01-15 ENCOUNTER — Ambulatory Visit: Payer: BC Managed Care – PPO

## 2019-01-16 ENCOUNTER — Ambulatory Visit: Payer: Self-pay

## 2019-01-16 ENCOUNTER — Other Ambulatory Visit: Payer: Self-pay

## 2019-01-16 DIAGNOSIS — Z23 Encounter for immunization: Secondary | ICD-10-CM | POA: Diagnosis not present

## 2019-01-16 NOTE — Patient Instructions (Signed)
Preventing Influenza, Adult Influenza, more commonly known as "the flu," is a viral infection that mainly affects the respiratory tract. The respiratory tract includes structures that help you breathe, such as the lungs, nose, and throat. The flu causes many common cold symptoms, as well as a high fever and body aches. The flu spreads easily from person to person (is contagious). The flu is most common from December through March. This is called flu season.You can catch the flu virus by:  Breathing in droplets from an infected person's cough or sneeze.  Touching something that was recently contaminated with the virus and then touching your mouth, nose, or eyes. What can I do to lower my risk?        You can decrease your risk of getting the flu by:  Getting a flu shot (influenza vaccination) every year. This is the best way to prevent the flu. A flu shot is recommended for everyone age 6 months and older. ? It is best to get a flu shot in the fall, as soon as it is available. Getting a flu shot during winter or spring instead is still a good idea. Flu season can last into early spring. ? Preventing the flu through vaccination requires getting a new flu shot every year. This is because the flu virus changes slightly (mutates) from one year to the next. Even if a flu shot does not completely protect you from all flu virus mutations, it can reduce the severity of your illness and prevent dangerous complications of the flu. ? If you are pregnant, you can and should get a flu shot. ? If you have had a reaction to the shot in the past or if you are allergic to eggs, check with your health care provider before getting a flu shot. ? Sometimes the vaccine is available as a nasal spray. In some years, the nasal spray has not been as effective against the flu virus. Check with your health care provider if you have questions about this.  Practicing good health habits. This is especially important during  flu season. ? Avoid contact with people who are sick with flu or cold symptoms. ? Wash your hands with soap and water often. If soap and water are not available, use alcohol-based hand sanitizer. ? Avoid touching your hands to your face, especially when you have not washed your hands recently. ? Use a disinfectant to clean surfaces at home and at work that may be contaminated with the flu virus. ? Keep your body's disease-fighting system (immune system) in good shape by eating a healthy diet, drinking plenty of fluids, getting enough sleep, and exercising regularly. If you do get the flu, avoid spreading it to others by:  Staying home until your symptoms have been gone for at least one day.  Covering your mouth and nose when you cough or sneeze.  Avoiding close contact with others, especially babies and elderly people. Why are these changes important? Getting a flu shot and practicing good health habits protects you as well as other people. If you get the flu, your friends, family, and co-workers are also at risk of getting it, because it spreads so easily to others. Each year, about 2 out of every 10 people get the flu. Having the flu can lead to complications, such as pneumonia, ear infection, and sinus infection. The flu also can be deadly, especially for babies, people older than age 65, and people who have serious long-term diseases. How is this treated? Most   people recover from the flu by resting at home and drinking plenty of fluids. However, a prescription antiviral medicine may reduce your flu symptoms and may make your flu go away sooner. This medicine must be started within a few days of getting flu symptoms. You can talk with your health care provider about whether you need an antiviral medicine. Antiviral medicine may be prescribed for people who are at risk for more serious flu symptoms. This includes people who:  Are older than age 65.  Are pregnant.  Have a condition that  makes the flu worse or more dangerous. Where to find more information  Centers for Disease Control and Prevention: www.cdc.gov/flu/index.htm  Flu.gov: www.flu.gov/prevention-vaccination  American Academy of Family Physicians: familydoctor.org/familydoctor/en/kids/vaccines/preventing-the-flu.html Contact a health care provider if:  You have influenza and you develop new symptoms.  You have: ? Chest pain. ? Diarrhea. ? A fever.  Your cough gets worse, or you produce more mucus. Summary  The best way to prevent the flu is to get a flu shot every year in the fall.  Even if you get the flu after you have received the yearly vaccine, your flu may be milder and go away sooner because of your flu shot.  If you get the flu, antiviral medicines that are started with a few days of symptoms may reduce your flu symptoms and may make your flu go away sooner.  You can also help prevent the flu by practicing good health habits. This information is not intended to replace advice given to you by your health care provider. Make sure you discuss any questions you have with your health care provider. Document Released: 05/03/2015 Document Revised: 03/31/2017 Document Reviewed: 12/26/2015 Elsevier Patient Education  2020 Elsevier Inc. Influenza (Flu) Vaccine (Inactivated or Recombinant): What You Need to Know 1. Why get vaccinated? Influenza vaccine can prevent influenza (flu). Flu is a contagious disease that spreads around the [place] every year, usually between October and May. Anyone can get the flu, but it is more dangerous for some people. Infants and young children, people 65 years of age and older, pregnant women, and people with certain health conditions or a weakened immune system are at greatest risk of flu complications. Pneumonia, bronchitis, sinus infections and ear infections are examples of flu-related complications. If you have a medical condition, such as heart disease, cancer or  diabetes, flu can make it worse. Flu can cause fever and chills, sore throat, muscle aches, fatigue, cough, headache, and runny or stuffy nose. Some people may have vomiting and diarrhea, though this is more common in children than adults. Each year thousands of people in the [place] die from flu, and many more are hospitalized. Flu vaccine prevents millions of illnesses and flu-related visits to the doctor each year. 2. Influenza vaccine CDC recommends everyone 6 months of age and older get vaccinated every flu season. Children 6 months through 8 years of age may need 2 doses during a single flu season. Everyone else needs only 1 dose each flu season. It takes about 2 weeks for protection to develop after vaccination. There are many flu viruses, and they are always changing. Each year a new flu vaccine is made to protect against three or four viruses that are likely to cause disease in the upcoming flu season. Even when the vaccine doesn't exactly match these viruses, it may still provide some protection. Influenza vaccine does not cause flu. Influenza vaccine may be given at the same time as other vaccines. 3.   Talk with your health care provider Tell your vaccine provider if the person getting the vaccine:  Has had an allergic reaction after a previous dose of influenza vaccine, or has any severe, life-threatening allergies.  Has ever had Guillain-Barr Syndrome (also called GBS). In some cases, your health care provider may decide to postpone influenza vaccination to a future visit. People with minor illnesses, such as a cold, may be vaccinated. People who are moderately or severely ill should usually wait until they recover before getting influenza vaccine. Your health care provider can give you more information. 4. Risks of a vaccine reaction  Soreness, redness, and swelling where shot is given, fever, muscle aches, and headache can happen after influenza vaccine.  There may be a  very small increased risk of Guillain-Barr Syndrome (GBS) after inactivated influenza vaccine (the flu shot). Young children who get the flu shot along with pneumococcal vaccine (PCV13), and/or DTaP vaccine at the same time might be slightly more likely to have a seizure caused by fever. Tell your health care provider if a child who is getting flu vaccine has ever had a seizure. People sometimes faint after medical procedures, including vaccination. Tell your provider if you feel dizzy or have vision changes or ringing in the ears. As with any medicine, there is a very remote chance of a vaccine causing a severe allergic reaction, other serious injury, or death. 5. What if there is a serious problem? An allergic reaction could occur after the vaccinated person leaves the clinic. If you see signs of a severe allergic reaction (hives, swelling of the face and throat, difficulty breathing, a fast heartbeat, dizziness, or weakness), call 9-1-1 and get the person to the nearest hospital. For other signs that concern you, call your health care provider. Adverse reactions should be reported to the Vaccine Adverse Event Reporting System (VAERS). Your health care provider will usually file this report, or you can do it yourself. Visit the VAERS website at www.vaers.hhs.gov or call 1-800-822-7967.VAERS is only for reporting reactions, and VAERS staff do not give medical advice. 6. The National Vaccine Injury Compensation Program The National Vaccine Injury Compensation Program (VICP) is a federal program that was created to compensate people who may have been injured by certain vaccines. Visit the VICP website at www.hrsa.gov/vaccinecompensation or call 1-800-338-2382 to learn about the program and about filing a claim. There is a time limit to file a claim for compensation. 7. How can I learn more?  Ask your healthcare provider.  Call your local or state health department.  Contact the Centers for Disease  Control and Prevention (CDC): ? Call 1-800-232-4636 (1-800-CDC-INFO) or ? Visit CDC's www.cdc.gov/flu Vaccine Information Statement (Interim) Inactivated Influenza Vaccine (12/14/2017) This information is not intended to replace advice given to you by your health care provider. Make sure you discuss any questions you have with your health care provider. Document Released: 02/10/2006 Document Revised: 08/07/2018 Document Reviewed: 12/18/2017 Elsevier Patient Education  2020 Elsevier Inc.  

## 2019-01-16 NOTE — Progress Notes (Signed)
     Patient ID: Darius Morrison, male    DOB: [birth year], 43 y.o.   MRN: [id number]    Thank you!!  [place] Nurse Specialist [place]: [phone]  Cell:  [phone] Website: [email or web address]

## 2019-01-22 ENCOUNTER — Other Ambulatory Visit: Payer: Self-pay | Admitting: Internal Medicine

## 2019-01-22 ENCOUNTER — Telehealth: Payer: Self-pay | Admitting: Internal Medicine

## 2019-01-22 ENCOUNTER — Other Ambulatory Visit: Payer: Self-pay

## 2019-01-22 ENCOUNTER — Encounter: Payer: Self-pay | Admitting: Internal Medicine

## 2019-01-22 DIAGNOSIS — E109 Type 1 diabetes mellitus without complications: Secondary | ICD-10-CM

## 2019-01-22 MED ORDER — INSULIN GLARGINE 100 UNIT/ML ~~LOC~~ SOLN: 36.0000 [IU] | Freq: Every day | SUBCUTANEOUS | 12 refills | Status: DC

## 2019-01-22 MED ORDER — FREESTYLE LIBRE 14 DAY SENSOR MISC: 1.0000 | 3 refills | Status: DC

## 2019-01-22 MED ORDER — INSULIN ASPART 100 UNIT/ML ~~LOC~~ SOLN: 20.0000 [IU] | Freq: Three times a day (TID) | SUBCUTANEOUS | 12 refills | Status: DC

## 2019-01-22 NOTE — Telephone Encounter (Signed)
Pt needs medications refilled, is out of insulin, send to [facility] in [place]. Was told to just call in when he was out and Dr. B would refill even though she did prescribe in the first place.  insulin glargine (LANTUS) 100 UNIT/ML injection [[id number]]    insulin aspart (NOVOLOG) 100 UNIT/ML injection [[id number]]   Continuous Blood Gluc Sensor (FREESTYLE LIBRE [place]) MISC [[id number]]

## 2019-01-23 ENCOUNTER — Other Ambulatory Visit: Payer: Self-pay

## 2019-01-23 DIAGNOSIS — E109 Type 1 diabetes mellitus without complications: Secondary | ICD-10-CM

## 2019-01-23 MED ORDER — INSULIN ASPART 100 UNIT/ML FLEXPEN: 20.0000 [IU] | PEN_INJECTOR | Freq: Three times a day (TID) | SUBCUTANEOUS | 5 refills | Status: DC

## 2019-01-23 MED ORDER — INSULIN GLARGINE 100 UNIT/ML SOLOSTAR PEN: 36.0000 [IU] | PEN_INJECTOR | Freq: Every day | SUBCUTANEOUS | 5 refills | Status: DC

## 2019-01-23 NOTE — Telephone Encounter (Signed)
Medications sent. Thank you.

## 2019-04-10 ENCOUNTER — Encounter: Payer: Self-pay | Admitting: Internal Medicine

## 2019-04-10 ENCOUNTER — Ambulatory Visit: Payer: BC Managed Care – PPO | Admitting: Internal Medicine

## 2019-04-10 ENCOUNTER — Other Ambulatory Visit: Payer: Self-pay

## 2019-04-10 VITALS — BP 116/78 | HR 88 | Ht 73.0 in | Wt 261.0 lb

## 2019-04-10 DIAGNOSIS — E109 Type 1 diabetes mellitus without complications: Secondary | ICD-10-CM | POA: Diagnosis not present

## 2019-04-10 DIAGNOSIS — G5603 Carpal tunnel syndrome, bilateral upper limbs: Secondary | ICD-10-CM | POA: Diagnosis not present

## 2019-04-10 MED ORDER — ATORVASTATIN CALCIUM 10 MG PO TABS: 10.0000 mg | ORAL_TABLET | Freq: Every day | ORAL | 3 refills | Status: DC

## 2019-04-10 MED ORDER — LISINOPRIL 5 MG PO TABS: 5.0000 mg | ORAL_TABLET | Freq: Every day | ORAL | 3 refills | Status: DC

## 2019-04-10 NOTE — Progress Notes (Signed)
Date:  04/10/2019   Name:  Darius Morrison   DOB:  [birth year]   MRN:  [id number]   Chief Complaint: Diabetes (3 month follow up. Foot Exam. ) and Numbness (Right hand goes numb in the last 6 months on and off. Wondering about carpel tunnel )  Diabetes He presents for his follow-up diabetic visit. He has type 1 diabetes mellitus. His disease course has been stable. Pertinent negatives for hypoglycemia include no dizziness, headaches or tremors. Pertinent negatives for diabetes include no chest pain, no fatigue, no polydipsia, no polyuria and no weakness. Symptoms are stable. Current diabetic treatment includes insulin injections. He is compliant with treatment all of the time. His weight is stable. He is following a generally healthy diet. He monitors blood glucose at home 5+ x per day. His home blood glucose trend is fluctuating minimally (average glucose 150). An ACE inhibitor/angiotensin II receptor blocker is being taken. Eye exam is current.    Lab Results  Component Value Date   CREATININE 1.3 10/10/2018   BUN 10 10/10/2018   Lab Results  Component Value Date   CHOL 138 10/10/2018   HDL 58 10/10/2018   LDLCALC 63 10/10/2018   TRIG 86 10/10/2018   No results found for: TSH Lab Results  Component Value Date   HGBA1C 7.2 (H) 01/09/2019     Review of Systems  Constitutional: Negative for appetite change, fatigue and unexpected weight change.  Eyes: Negative for visual disturbance.  Respiratory: Negative for cough, shortness of breath and wheezing.   Cardiovascular: Negative for chest pain, palpitations and leg swelling.  Gastrointestinal: Negative for abdominal pain and blood in stool.  Endocrine: Negative for polydipsia and polyuria.  Genitourinary: Negative for dysuria and hematuria.  Skin: Negative for color change and rash.  Neurological: Positive for numbness (in right hand >>> left hand). Negative for dizziness, tremors, weakness and headaches.   Psychiatric/Behavioral: Negative for dysphoric mood.    Patient Active Problem List   Diagnosis Date Noted  . DM (diabetes mellitus), type 1 ([place]) 09/26/2017  . Hyperlipidemia LDL goal <100 09/26/2017  . Thyroid nodule 2017  . ED (erectile dysfunction) 03/13/2013  . OSA on CPAP 11/22/2011    No Known Allergies  Past Surgical History:  Procedure Laterality Date  . CYST REMOVAL TRUNK  2000    Social History   Tobacco Use  . Smoking status: Never Smoker  . Smokeless tobacco: Never Used  Substance Use Topics  . Alcohol use: Yes    Comment: rarely  . Drug use: No     Medication list has been reviewed and updated.  Current Meds  Medication Sig  . aspirin EC 81 MG tablet Take 81 mg by mouth daily.  Marland Kitchen atorvastatin (LIPITOR) 10 MG tablet Take 10 mg by mouth daily.  . Continuous Blood Gluc Sensor (FREESTYLE LIBRE 14 DAY SENSOR) MISC Place 1 each onto the skin every 14 (fourteen) days.  . insulin aspart (NOVOLOG) 100 UNIT/ML FlexPen Inject 20 Units into the skin 3 (three) times daily with meals.  . Insulin Glargine (LANTUS) 100 UNIT/ML Solostar Pen Inject 36 Units into the skin at bedtime.  Marland Kitchen lisinopril (PRINIVIL,ZESTRIL) 5 MG tablet Take 5 mg by mouth daily.  . sildenafil (REVATIO) 20 MG tablet Take 20 mg by mouth 3 (three) times daily. [facility] [place]    PHQ 2/9 Scores 04/10/2019 01/09/2019  PHQ - 2 Score 0 0  PHQ- 9 Score 0 -    BP Readings from  Last 3 Encounters:  04/10/19 116/78  01/14/19 109/77  01/09/19 128/70    Physical Exam Vitals signs and nursing note reviewed.  Constitutional:      General: He is not in acute distress.    Appearance: Normal appearance. He is well-developed.  HENT:     Head: Normocephalic and atraumatic.  Neck:     Musculoskeletal: Normal range of motion.  Cardiovascular:     Rate and Rhythm: Normal rate and regular rhythm.     Pulses: Normal pulses.     Heart sounds: No murmur.  Pulmonary:     Effort: Pulmonary effort is  normal. No respiratory distress.     Breath sounds: No wheezing or rhonchi.  Musculoskeletal: Normal range of motion.     Right lower leg: No edema.     Left lower leg: No edema.  Lymphadenopathy:     Cervical: No cervical adenopathy.  Skin:    General: Skin is warm and dry.     Capillary Refill: Capillary refill takes less than 2 seconds.     Findings: No rash.  Neurological:     General: No focal deficit present.     Mental Status: He is alert and oriented to person, place, and time.     Motor: Motor function is intact. No weakness or atrophy.     Deep Tendon Reflexes:     Reflex Scores:      Bicep reflexes are 2+ on the right side and 2+ on the left side.      Patellar reflexes are 2+ on the right side and 2+ on the left side.    Comments: Tinel's and Phalen's positive on Right Phalen's positive on Left  Psychiatric:        Attention and Perception: Attention normal.        Mood and Affect: Mood normal.        Behavior: Behavior normal.        Thought Content: Thought content normal.     Wt Readings from Last 3 Encounters:  04/10/19 261 lb (118.4 kg)  01/14/19 255 lb (115.7 kg)  01/09/19 262 lb (118.8 kg)    BP 116/78   Pulse 88   Ht 6\' 1"  (1.854 m)   Wt 261 lb (118.4 kg)   SpO2 95%   BMI 34.43 kg/m   Assessment and Plan: 1. Type 1 diabetes mellitus without complication (HCC) Clinically stable by exam and report without s/s of hypoglycemia. DM complicated by lipids. Tolerating medications - insulin injections 4 times a day,  well without side effects or other concerns. - atorvastatin (LIPITOR) 10 MG tablet; Take 1 tablet (10 mg total) by mouth daily.  Dispense: 90 tablet; Refill: 3 - lisinopril (ZESTRIL) 5 MG tablet; Take 1 tablet (5 mg total) by mouth daily.  Dispense: 90 tablet; Refill: 3 - Hemoglobin A1c - Basic metabolic panel  2. Carpal tunnel syndrome on both sides Recommend wrist splints to be worn at night Can also take Advil or Aleve daily if  symptoms are worsening - TSH   Partially dictated using . Any errors are unintentional.  Animal nutritionist, MD [facility]  04/10/2019

## 2019-04-10 NOTE — Patient Instructions (Signed)
Carpal Tunnel Syndrome  Carpal tunnel syndrome is a condition that causes pain in your hand and arm. The carpal tunnel is a narrow area located on the palm side of your wrist. Repeated wrist motion or certain diseases may cause swelling within the tunnel. This swelling pinches the main nerve in the wrist (median nerve). What are the causes? This condition may be caused by:  Repeated wrist motions.  Wrist injuries.  Arthritis.  A cyst or tumor in the carpal tunnel.  Fluid buildup during pregnancy. Sometimes the cause of this condition is not known. What increases the risk? The following factors may make you more likely to develop this condition:  Having a job, such as being a butcher or a cashier, that requires you to repeatedly move your wrist in the same motion.  Being a woman.  Having certain conditions, such as: ? Diabetes. ? Obesity. ? An underactive thyroid (hypothyroidism). ? Kidney failure. What are the signs or symptoms? Symptoms of this condition include:  A tingling feeling in your fingers, especially in your thumb, index, and middle fingers.  Tingling or numbness in your hand.  An aching feeling in your entire arm, especially when your wrist and elbow are bent for a long time.  Wrist pain that goes up your arm to your shoulder.  Pain that goes down into your palm or fingers.  A weak feeling in your hands. You may have trouble grabbing and holding items. Your symptoms may feel worse during the night. How is this diagnosed? This condition is diagnosed with a medical history and physical exam. You may also have tests, including:  Electromyogram (EMG). This test measures electrical signals sent by your nerves into the muscles.  Nerve conduction study. This test measures how well electrical signals pass through your nerves.  Imaging tests, such as X-rays, ultrasound, and MRI. These tests check for possible causes of your condition. How is this treated? This  condition may be treated with:  Lifestyle changes. It is important to stop or change the activity that caused your condition.  Doing exercise and activities to strengthen your muscles and bones (physical therapy).  Learning how to use your hand again after diagnosis (occupational therapy).  Medicines for pain and inflammation. This may include medicine that is injected into your wrist.  A wrist splint.  Surgery. Follow these instructions at home: If you have a splint:  Wear the splint as told by your health care provider. Remove it only as told by your health care provider.  Loosen the splint if your fingers tingle, become numb, or turn cold and blue.  Keep the splint clean.  If the splint is not waterproof: ? Do not let it get wet. ? Cover it with a watertight covering when you take a bath or shower. Managing pain, stiffness, and swelling   If directed, put ice on the painful area: ? If you have a removable splint, remove it as told by your health care provider. ? Put ice in a plastic bag. ? Place a towel between your skin and the bag. ? Leave the ice on for 20 minutes, 2-3 times per day. General instructions  Take over-the-counter and prescription medicines only as told by your health care provider.  Rest your wrist from any activity that may be causing your pain. If your condition is work related, talk with your employer about changes that can be made, such as getting a wrist pad to use while typing.  Do any exercises as told   by your health care provider, physical therapist, or occupational therapist.  Keep all follow-up visits as told by your health care provider. This is important. Contact a health care provider if:  You have new symptoms.  Your pain is not controlled with medicines.  Your symptoms get worse. Get help right away if:  You have severe numbness or tingling in your wrist or hand. Summary  Carpal tunnel syndrome is a condition that causes pain in  your hand and arm.  It is usually caused by repeated wrist motions.  Lifestyle changes and medicines are used to treat carpal tunnel syndrome. Surgery may be recommended.  Follow your health care provider's instructions about wearing a splint, resting from activity, keeping follow-up visits, and calling for help. This information is not intended to replace advice given to you by your health care provider. Make sure you discuss any questions you have with your health care provider. Document Released: 04/15/2000 Document Revised: 08/25/2017 Document Reviewed: 08/25/2017 Elsevier Patient Education  2020 Elsevier Inc.  

## 2019-04-11 LAB — BASIC METABOLIC PANEL
BUN/Creatinine Ratio: 13 (ref 9–20)
BUN: 16 mg/dL (ref 6–24)
CO2: 24 mmol/L (ref 20–29)
Calcium: 9.8 mg/dL (ref 8.7–10.2)
Chloride: 102 mmol/L (ref 96–106)
Creatinine, Ser: 1.27 mg/dL (ref 0.76–1.27)
GFR calc Af Amer: 78 mL/min/{1.73_m2} (ref >59)
GFR calc non Af Amer: 68 mL/min/{1.73_m2} (ref >59)
Glucose: 54 mg/dL — ABNORMAL LOW (ref 65–99)
Potassium: 4.9 mmol/L (ref 3.5–5.2)
Sodium: 138 mmol/L (ref 134–144)

## 2019-04-11 LAB — HEMOGLOBIN A1C
Est. average glucose Bld gHb Est-mCnc: 157 mg/dL
Hgb A1c MFr Bld: 7.1 % — ABNORMAL HIGH (ref 4.8–5.6)

## 2019-04-11 LAB — TSH: TSH: 1.07 u[IU]/mL (ref 0.450–4.500)

## 2019-05-07 ENCOUNTER — Other Ambulatory Visit: Payer: Self-pay

## 2019-05-07 MED ORDER — INSULIN GLARGINE 100 UNIT/ML SOLOSTAR PEN: 36.0000 [IU] | PEN_INJECTOR | Freq: Every day | SUBCUTANEOUS | 5 refills | Status: DC

## 2019-07-14 ENCOUNTER — Other Ambulatory Visit: Payer: Self-pay | Admitting: Internal Medicine

## 2019-08-15 ENCOUNTER — Ambulatory Visit (INDEPENDENT_AMBULATORY_CARE_PROVIDER_SITE_OTHER): Payer: BC Managed Care – PPO | Admitting: Internal Medicine

## 2019-08-15 ENCOUNTER — Encounter: Payer: Self-pay | Admitting: Internal Medicine

## 2019-08-15 ENCOUNTER — Other Ambulatory Visit: Payer: Self-pay

## 2019-08-15 VITALS — BP 104/78 | HR 74 | Temp 98.5°F | Ht 73.0 in | Wt 259.0 lb

## 2019-08-15 DIAGNOSIS — Z125 Encounter for screening for malignant neoplasm of prostate: Secondary | ICD-10-CM

## 2019-08-15 DIAGNOSIS — E041 Nontoxic single thyroid nodule: Secondary | ICD-10-CM

## 2019-08-15 DIAGNOSIS — G4733 Obstructive sleep apnea (adult) (pediatric): Secondary | ICD-10-CM | POA: Diagnosis not present

## 2019-08-15 DIAGNOSIS — E785 Hyperlipidemia, unspecified: Secondary | ICD-10-CM | POA: Diagnosis not present

## 2019-08-15 DIAGNOSIS — E1065 Type 1 diabetes mellitus with hyperglycemia: Secondary | ICD-10-CM

## 2019-08-15 DIAGNOSIS — Z0000 Encounter for general adult medical examination without abnormal findings: Secondary | ICD-10-CM | POA: Diagnosis not present

## 2019-08-15 DIAGNOSIS — N529 Male erectile dysfunction, unspecified: Secondary | ICD-10-CM | POA: Diagnosis not present

## 2019-08-15 DIAGNOSIS — Z9989 Dependence on other enabling machines and devices: Secondary | ICD-10-CM

## 2019-08-15 LAB — POCT URINALYSIS DIPSTICK
Bilirubin, UA: NEGATIVE
Blood, UA: NEGATIVE
Glucose, UA: NEGATIVE
Ketones, UA: NEGATIVE
Leukocytes, UA: NEGATIVE
Nitrite, UA: NEGATIVE
Protein, UA: NEGATIVE
Spec Grav, UA: 1.015 (ref 1.010–1.025)
Urobilinogen, UA: 0.2 E.U./dL
pH, UA: 6 (ref 5.0–8.0)

## 2019-08-15 MED ORDER — SILDENAFIL CITRATE 20 MG PO TABS: 20.0000 mg | ORAL_TABLET | Freq: Three times a day (TID) | ORAL | 1 refills | Status: DC

## 2019-08-15 NOTE — Patient Instructions (Signed)
Increase Lantus insulin gradually - by 2 units every few weeks and continue to monitor closely to avoid low readings. If you need a new prescription with different instructions, call and leave a message - or use Mychart messaging.

## 2019-08-15 NOTE — Progress Notes (Signed)
Date:  08/15/2019   Name:  Darius Morrison   DOB:  1975-06-10   MRN:  [id number]   Chief Complaint: Annual Exam and Diabetes Darius Morrison is a 44 y.o. male who presents today for his Complete Annual Exam. He feels well. He reports exercising regularly. He reports he is sleeping fairly well.   Immunization History  Administered Date(s) Administered  . Influenza, Seasonal, Injecte, Preservative Fre 03/05/2018  . Influenza,inj,Quad PF,6+ Mos 01/16/2019  . PFIZER SARS-COV-2 Vaccination 07/09/2019, 08/06/2019  . Pneumococcal Polysaccharide-23 03/13/2014  . Tdap 09/10/2012    Diabetes He presents for his follow-up diabetic visit. He has type 1 diabetes mellitus. His disease course has been worsening (bs are running a bit higher over the past month). Hypoglycemia symptoms include sweats. Pertinent negatives for hypoglycemia include no dizziness, headaches or nervousness/anxiousness. Pertinent negatives for diabetes include no blurred vision, no chest pain, no fatigue, no foot paresthesias, no foot ulcerations, no visual change and no weight loss. There are no hypoglycemic complications. Symptoms are stable. There are no diabetic complications. Current diabetic treatment includes insulin injections. He is compliant with treatment all of the time. His weight is stable. He is following a generally healthy diet. He monitors blood glucose at home 5+ x per day (using Free style [place]). Home blood sugar record trend: generally 150 - 180. An ACE inhibitor/angiotensin II receptor blocker is not being taken. Eye exam is current.  Hyperlipidemia The problem is controlled. Pertinent negatives include no chest pain, myalgias or shortness of breath. Current antihyperlipidemic treatment includes statins.    Lab Results  Component Value Date   CREATININE 1.27 04/10/2019   BUN 16 04/10/2019   NA 138 04/10/2019   K 4.9 04/10/2019   CL 102 04/10/2019   CO2 24 04/10/2019   Lab Results  Component Value  Date   CHOL 138 10/10/2018   HDL 58 10/10/2018   LDLCALC 63 10/10/2018   TRIG 86 10/10/2018   Lab Results  Component Value Date   TSH 1.070 04/10/2019   Lab Results  Component Value Date   HGBA1C 7.1 (H) 04/10/2019   No results found for: WBC, HGB, HCT, MCV, PLT No results found for: ALT, AST, GGT, ALKPHOS, BILITOT   Review of Systems  Constitutional: Negative for appetite change, chills, diaphoresis, fatigue, unexpected weight change and weight loss.  HENT: Positive for trouble swallowing (occasional sensation of fullnes in anterior neck). Negative for hearing loss, tinnitus and voice change.   Eyes: Negative for blurred vision and visual disturbance.  Respiratory: Negative for choking, shortness of breath and wheezing.   Cardiovascular: Negative for chest pain, palpitations and leg swelling.  Gastrointestinal: Negative for abdominal pain, blood in stool, constipation and diarrhea.  Genitourinary: Negative for difficulty urinating, dysuria and frequency.  Musculoskeletal: Negative for arthralgias, back pain and myalgias.  Skin: Negative for color change and rash.  Allergic/Immunologic: Negative for environmental allergies.  Neurological: Negative for dizziness, syncope and headaches.  Hematological: Negative for adenopathy.  Psychiatric/Behavioral: Negative for dysphoric mood and sleep disturbance. The patient is not nervous/anxious.     Patient Active Problem List   Diagnosis Date Noted  . Carpal tunnel syndrome on both sides 04/10/2019  . DM (diabetes mellitus), type 1 (HCC) 09/26/2017  . Hyperlipidemia LDL goal <100 09/26/2017  . Thyroid nodule 2017  . ED (erectile dysfunction) 03/13/2013  . OSA on CPAP 11/22/2011    No Known Allergies  Past Surgical History:  Procedure Laterality Date  . CYST REMOVAL  TRUNK  2000    Social History   Tobacco Use  . Smoking status: Never Smoker  . Smokeless tobacco: Never Used  Substance Use Topics  . Alcohol use: Yes     Comment: rarely  . Drug use: No     Medication list has been reviewed and updated.  Current Meds  Medication Sig  . aspirin EC 81 MG tablet Take 81 mg by mouth daily.  Marland Kitchen atorvastatin (LIPITOR) 10 MG tablet Take 1 tablet (10 mg total) by mouth daily.  . Continuous Blood Gluc Sensor (FREESTYLE LIBRE 14 DAY SENSOR) MISC Place 1 each onto the skin every 14 (fourteen) days.  . Insulin Aspart FlexPen 100 UNIT/ML SOPN ADMINISTER 20 UNITS UNDER THE SKIN THREE TIMES DAILY WITH MEALS  . Insulin Glargine (LANTUS) 100 UNIT/ML Solostar Pen Inject 36 Units into the skin at bedtime.  Marland Kitchen lisinopril (ZESTRIL) 5 MG tablet Take 1 tablet (5 mg total) by mouth daily.  . NON FORMULARY CPAP nightly  . sildenafil (REVATIO) 20 MG tablet Take 1 tablet (20 mg total) by mouth 3 (three) times daily. [facility] [employer]  . [DISCONTINUED] sildenafil (REVATIO) 20 MG tablet Take 20 mg by mouth 3 (three) times daily. [facility] [place]    PHQ 2/9 Scores 08/15/2019 04/10/2019 01/09/2019  PHQ - 2 Score 0 0 0  PHQ- 9 Score 0 0 -    BP Readings from Last 3 Encounters:  08/15/19 104/78  04/10/19 116/78  01/14/19 109/77    Physical Exam Vitals and nursing note reviewed.  Constitutional:      Appearance: Normal appearance. He is well-developed.  HENT:     Head: Normocephalic.     Right Ear: Tympanic membrane, ear canal and external ear normal.     Left Ear: Tympanic membrane, ear canal and external ear normal.     Nose: Nose normal.     Mouth/Throat:     Pharynx: Uvula midline.  Eyes:     Conjunctiva/sclera: Conjunctivae normal.     Pupils: Pupils are equal, round, and reactive to light.  Neck:     Thyroid: No thyromegaly.     Vascular: No carotid bruit.  Cardiovascular:     Rate and Rhythm: Normal rate and regular rhythm.     Heart sounds: Normal heart sounds.  Pulmonary:     Effort: Pulmonary effort is normal.     Breath sounds: Normal breath sounds. No wheezing.  Chest:     Breasts:         Right: No mass.        Left: No mass.  Abdominal:     General: Bowel sounds are normal.     Palpations: Abdomen is soft.     Tenderness: There is no abdominal tenderness.  Musculoskeletal:        General: Normal range of motion.     Cervical back: Normal range of motion and neck supple.     Right lower leg: No edema.     Left lower leg: No edema.  Lymphadenopathy:     Cervical: No cervical adenopathy.  Skin:    General: Skin is warm and dry.     Capillary Refill: Capillary refill takes less than 2 seconds.  Neurological:     General: No focal deficit present.     Mental Status: He is alert and oriented to person, place, and time.     Sensory: No sensory deficit.     Deep Tendon Reflexes: Reflexes are normal and  symmetric.  Psychiatric:        Attention and Perception: Attention normal.        Mood and Affect: Mood normal.        Speech: Speech normal.        Thought Content: Thought content normal.     Wt Readings from Last 3 Encounters:  08/15/19 259 lb (117.5 kg)  04/10/19 261 lb (118.4 kg)  01/14/19 255 lb (115.7 kg)    BP 104/78   Pulse 74   Temp 98.5 F (36.9 C) (Oral)   Ht 6\' 1"  (1.854 m)   Wt 259 lb (117.5 kg)   SpO2 98%   BMI 34.17 kg/m   Assessment and Plan: 1. Annual physical exam Normal exam except for weight Continue to improve diet, continue exercise - POCT urinalysis dipstick  2. Type 1 diabetes mellitus with hyperglycemia (HCC) Glucoses are gradually increasing with occasional low readings He will increase Lantus by 2 units every few weeks with close BS checks to achieve an average closer to 140. Continue current dose of Aspart insulin with meals - CBC with Differential/Platelet - Comprehensive metabolic panel - Hemoglobin A1c  3. Hyperlipidemia LDL goal <100 Tolerating statin medication without side effects at this time LDL is at goal of < 70 on current dose Continue same therapy without change at this time. - Lipid panel  4. Thyroid  nodule Mild compressive symptoms at times [place] ordered last year but never scheduled - will send message to [facility] - TSH + free T4  5. OSA on CPAP Doing well with good compliance  6. ED (erectile dysfunction) of organic origin - sildenafil (REVATIO) 20 MG tablet; Take 1 tablet (20 mg total) by mouth 3 (three) times daily. [facility] [place]  Dispense: 30 tablet; Refill: 1  7. Prostate cancer screening DRE deferred to lack of sx - PSA   Partially dictated using Dragon software. Any errors are unintentional.  Halina Maidens, MD [facility]  08/15/2019

## 2019-08-16 LAB — COMPREHENSIVE METABOLIC PANEL
ALT: 23 IU/L (ref 0–44)
AST: 22 IU/L (ref 0–40)
Albumin/Globulin Ratio: 1.8 (ref 1.2–2.2)
Albumin: 4.6 g/dL (ref 4.0–5.0)
Alkaline Phosphatase: 79 IU/L (ref 39–117)
BUN/Creatinine Ratio: 11 (ref 9–20)
BUN: 14 mg/dL (ref 6–24)
Bilirubin Total: 0.5 mg/dL (ref 0.0–1.2)
CO2: 25 mmol/L (ref 20–29)
Calcium: 10.2 mg/dL (ref 8.7–10.2)
Chloride: 105 mmol/L (ref 96–106)
Creatinine, Ser: 1.23 mg/dL (ref 0.76–1.27)
GFR calc Af Amer: 81 mL/min/{1.73_m2} (ref >59)
GFR calc non Af Amer: 70 mL/min/{1.73_m2} (ref >59)
Globulin, Total: 2.6 g/dL (ref 1.5–4.5)
Glucose: 40 mg/dL — ABNORMAL LOW (ref 65–99)
Potassium: 4.7 mmol/L (ref 3.5–5.2)
Sodium: 144 mmol/L (ref 134–144)
Total Protein: 7.2 g/dL (ref 6.0–8.5)

## 2019-08-16 LAB — LIPID PANEL
Chol/HDL Ratio: 2 ratio (ref 0.0–5.0)
Cholesterol, Total: 133 mg/dL (ref 100–199)
HDL: 66 mg/dL (ref >39)
LDL Chol Calc (NIH): 51 mg/dL (ref 0–99)
Triglycerides: 84 mg/dL (ref 0–149)
VLDL Cholesterol Cal: 16 mg/dL (ref 5–40)

## 2019-08-16 LAB — CBC WITH DIFFERENTIAL/PLATELET
Basophils Absolute: 0.1 10*3/uL (ref 0.0–0.2)
Basos: 1 %
EOS (ABSOLUTE): 0.2 10*3/uL (ref 0.0–0.4)
Eos: 3 %
Hematocrit: 47.7 % (ref 37.5–51.0)
Hemoglobin: 16.2 g/dL (ref 13.0–17.7)
Immature Grans (Abs): 0 10*3/uL (ref 0.0–0.1)
Immature Granulocytes: 1 %
Lymphocytes Absolute: 2.4 10*3/uL (ref 0.7–3.1)
Lymphs: 29 %
MCH: 31.6 pg (ref 26.6–33.0)
MCHC: 34 g/dL (ref 31.5–35.7)
MCV: 93 fL (ref 79–97)
Monocytes Absolute: 0.5 10*3/uL (ref 0.1–0.9)
Monocytes: 6 %
Neutrophils Absolute: 5 10*3/uL (ref 1.4–7.0)
Neutrophils: 60 %
Platelets: 203 10*3/uL (ref 150–450)
RBC: 5.12 x10E6/uL (ref 4.14–5.80)
RDW: 12.4 % (ref 11.6–15.4)
WBC: 8.2 10*3/uL (ref 3.4–10.8)

## 2019-08-16 LAB — TSH+FREE T4
Free T4: 1.2 ng/dL (ref 0.82–1.77)
TSH: 0.936 u[IU]/mL (ref 0.450–4.500)

## 2019-08-16 LAB — HEMOGLOBIN A1C
Est. average glucose Bld gHb Est-mCnc: 160 mg/dL
Hgb A1c MFr Bld: 7.2 % — ABNORMAL HIGH (ref 4.8–5.6)

## 2019-08-16 LAB — PSA: Prostate Specific Ag, Serum: 0.7 ng/mL (ref 0.0–4.0)

## 2019-08-22 ENCOUNTER — Ambulatory Visit: Payer: BC Managed Care – PPO | Attending: Internal Medicine

## 2019-09-08 IMAGING — CR DG CHEST 2V
2 series · 3 of 3 positions shown · non-contrast
Comparison: None.

CLINICAL DATA: Difficulty breathing, cough, congestion

EXAM:
CHEST  2 VIEW

[Series 1: chest pa · 0.14mm/px · 2 of 2 slices shown]
[im 1/2]
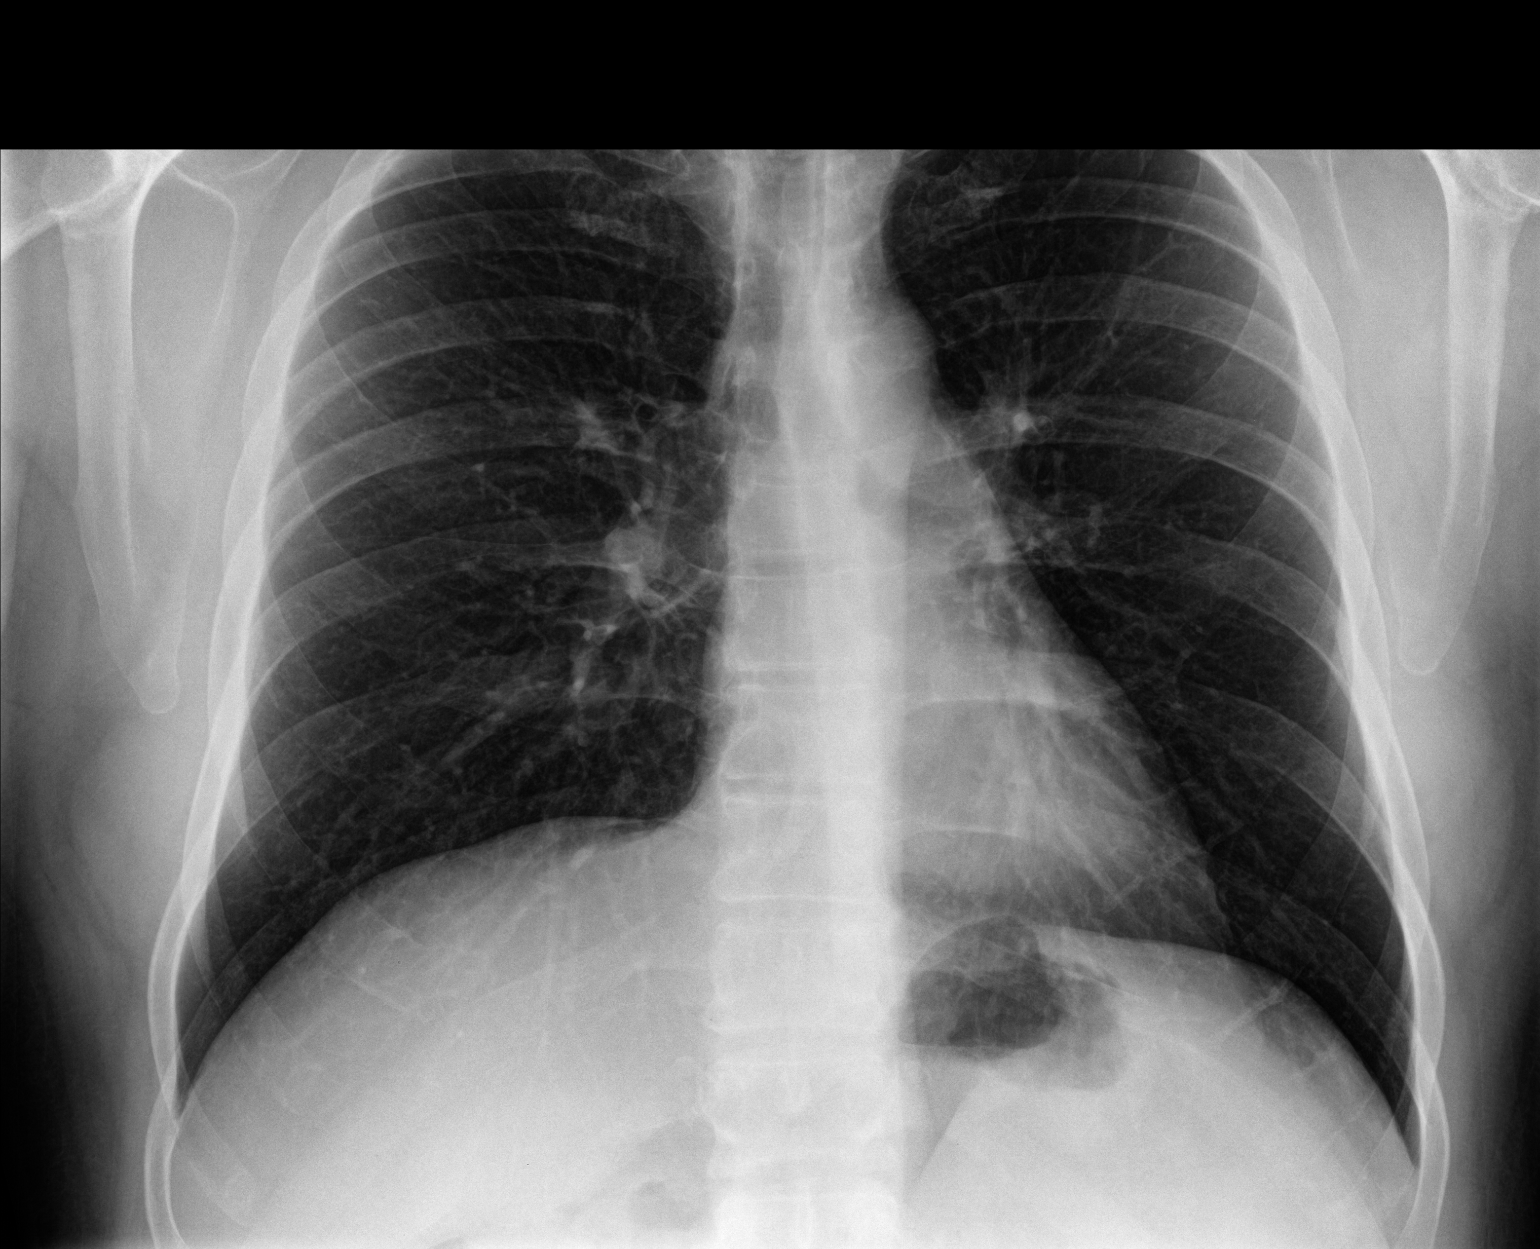
[im 2/2]
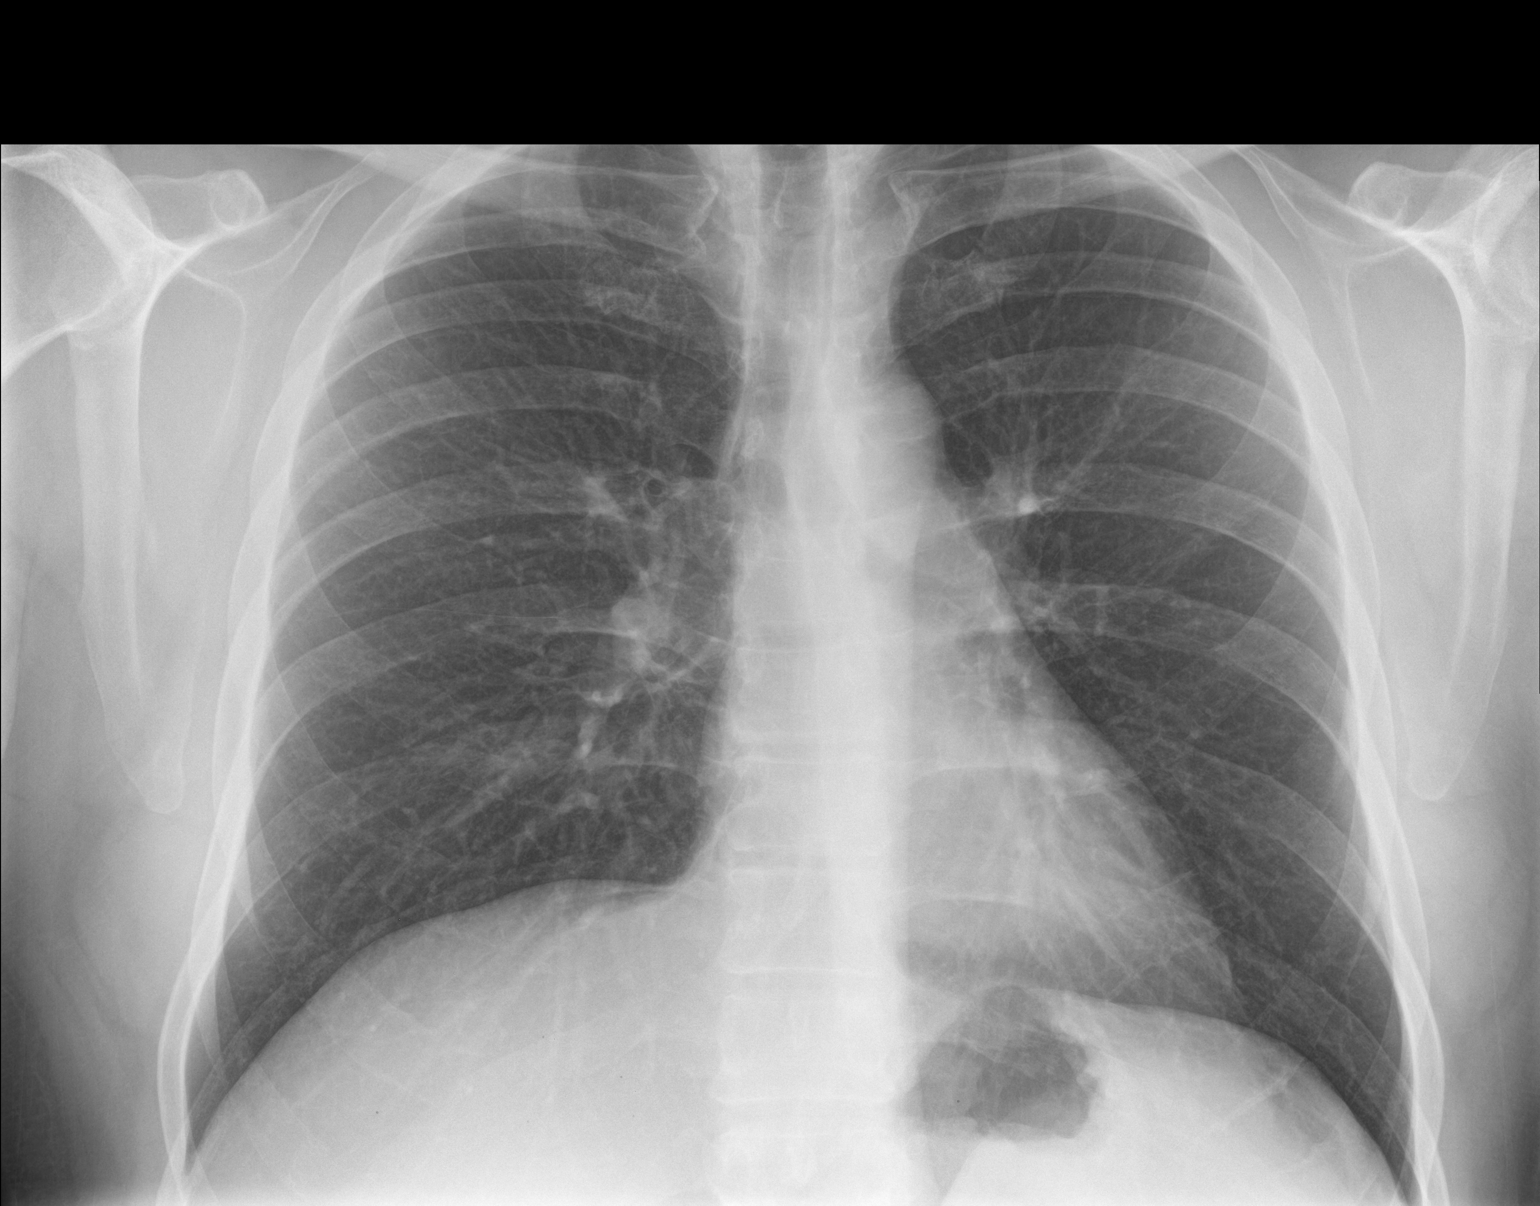

[chest lat]
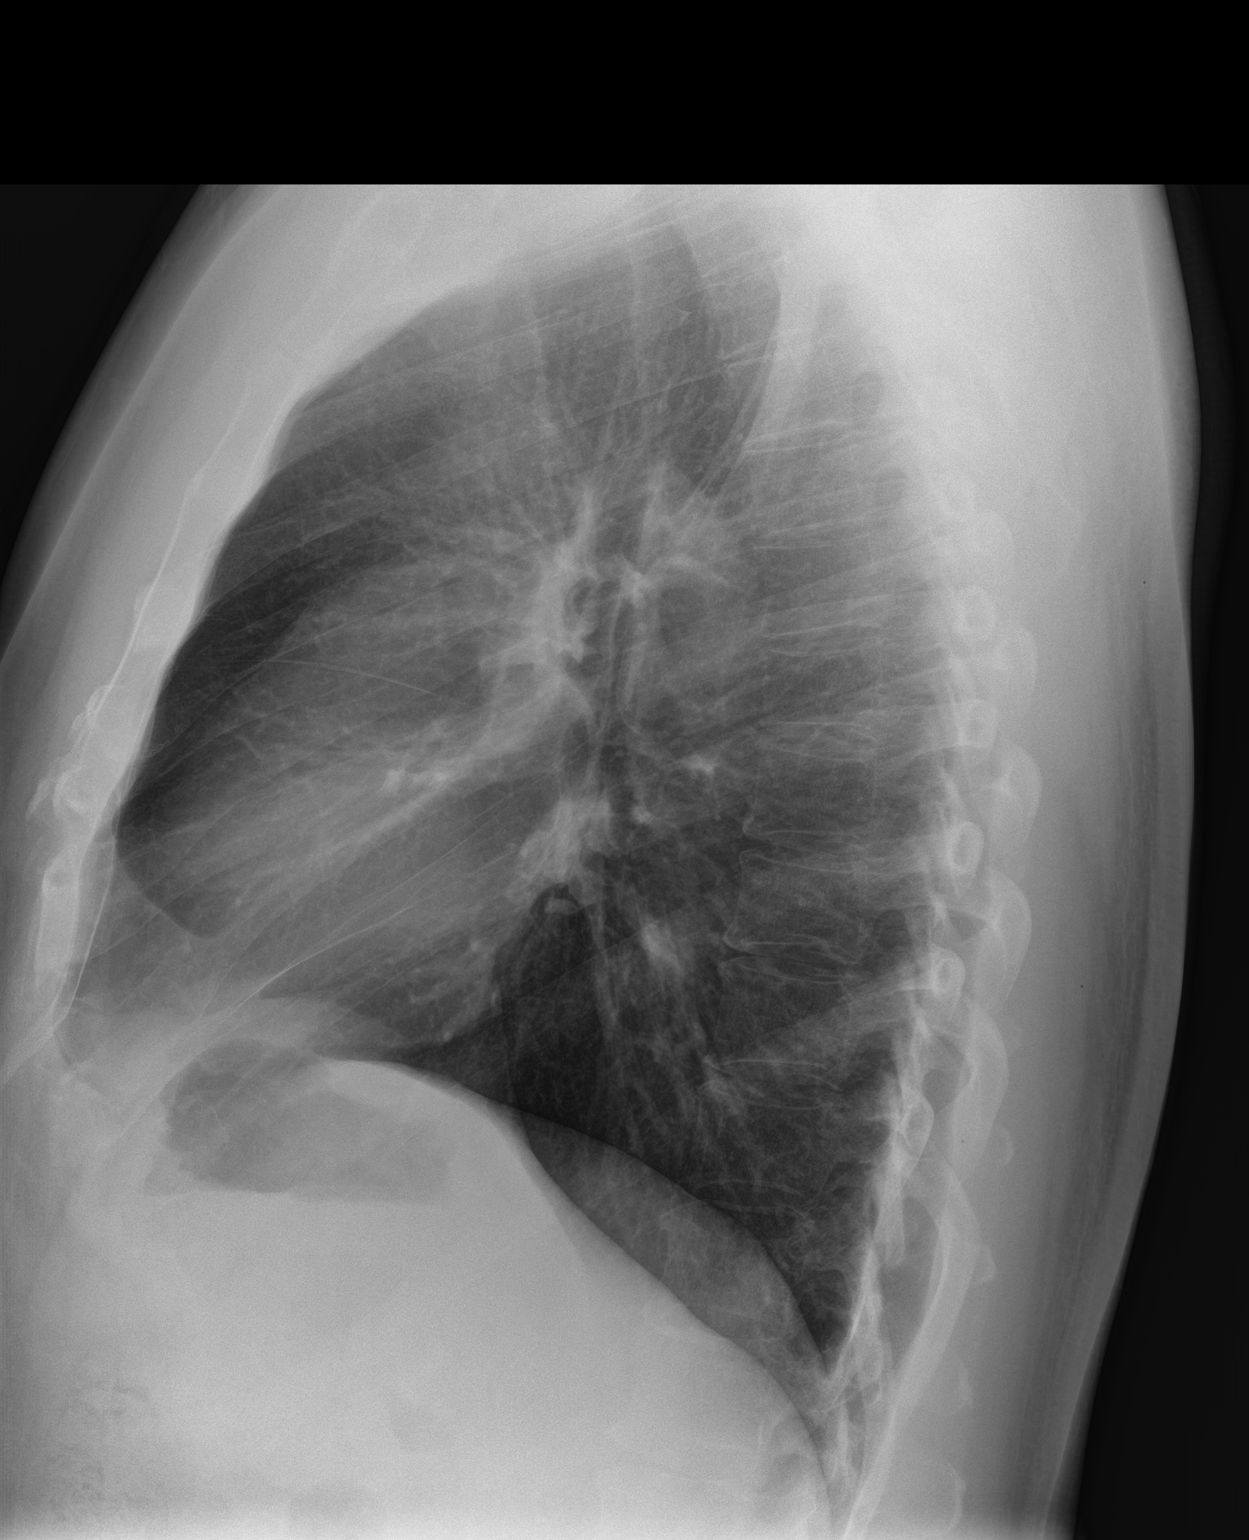

[3 of 3 positions shown; findings below may reference images not displayed]

FINDINGS: Heart and mediastinal contours are within normal limits. No focal
opacities or effusions. No acute bony abnormality.
IMPRESSION: No active cardiopulmonary disease.

## 2019-09-09 ENCOUNTER — Other Ambulatory Visit: Payer: Self-pay

## 2019-09-09 ENCOUNTER — Ambulatory Visit
Admission: RE | Admit: 2019-09-09 | Discharge: 2019-09-09 | Disposition: A | Discharge status: Home / Self Care | Payer: BC Managed Care – PPO | Source: Ambulatory Visit | Attending: Internal Medicine | Admitting: Internal Medicine

## 2019-09-09 DIAGNOSIS — E041 Nontoxic single thyroid nodule: Secondary | ICD-10-CM | POA: Diagnosis not present

## 2019-09-18 ENCOUNTER — Encounter: Payer: Self-pay | Admitting: Internal Medicine

## 2019-09-18 ENCOUNTER — Other Ambulatory Visit: Payer: Self-pay

## 2019-09-18 DIAGNOSIS — E109 Type 1 diabetes mellitus without complications: Secondary | ICD-10-CM

## 2019-09-18 MED ORDER — LISINOPRIL 5 MG PO TABS: 5.0000 mg | ORAL_TABLET | Freq: Every day | ORAL | 3 refills | Status: DC

## 2019-09-18 MED ORDER — FREESTYLE LIBRE 14 DAY SENSOR MISC: 1.0000 | 3 refills | Status: DC

## 2019-09-18 MED ORDER — ATORVASTATIN CALCIUM 10 MG PO TABS: 10.0000 mg | ORAL_TABLET | Freq: Every day | ORAL | 3 refills | Status: DC

## 2019-09-18 MED ORDER — INSULIN GLARGINE 100 UNIT/ML SOLOSTAR PEN: 36.0000 [IU] | PEN_INJECTOR | Freq: Every day | SUBCUTANEOUS | 5 refills | Status: DC

## 2019-09-18 MED ORDER — INSULIN ASPART FLEXPEN 100 UNIT/ML ~~LOC~~ SOPN
PEN_INJECTOR | SUBCUTANEOUS | 5 refills | Status: DC
Start: 2019-09-18 — End: 2019-12-20

## 2019-12-02 ENCOUNTER — Other Ambulatory Visit: Payer: Self-pay | Admitting: Internal Medicine

## 2019-12-02 DIAGNOSIS — N529 Male erectile dysfunction, unspecified: Secondary | ICD-10-CM

## 2019-12-17 ENCOUNTER — Ambulatory Visit: Payer: BC Managed Care – PPO | Admitting: Internal Medicine

## 2019-12-20 ENCOUNTER — Ambulatory Visit: Payer: BC Managed Care – PPO | Admitting: Internal Medicine

## 2019-12-20 ENCOUNTER — Other Ambulatory Visit: Payer: Self-pay

## 2019-12-20 ENCOUNTER — Encounter: Payer: Self-pay | Admitting: Internal Medicine

## 2019-12-20 ENCOUNTER — Other Ambulatory Visit
Admission: RE | Admit: 2019-12-20 | Discharge: 2019-12-20 | Disposition: A | Discharge status: Home / Self Care | Payer: BC Managed Care – PPO | Attending: Internal Medicine | Admitting: Internal Medicine

## 2019-12-20 VITALS — BP 110/78 | HR 77 | Temp 98.1°F | Ht 73.0 in | Wt 262.0 lb

## 2019-12-20 DIAGNOSIS — Z1159 Encounter for screening for other viral diseases: Secondary | ICD-10-CM | POA: Diagnosis not present

## 2019-12-20 DIAGNOSIS — E1065 Type 1 diabetes mellitus with hyperglycemia: Secondary | ICD-10-CM

## 2019-12-20 LAB — HEMOGLOBIN A1C
Hgb A1c MFr Bld: 6.9 % — ABNORMAL HIGH (ref 4.8–5.6)
Mean Plasma Glucose: 151.33 mg/dL

## 2019-12-20 LAB — HEPATITIS C ANTIBODY: HCV Ab: NONREACTIVE

## 2019-12-20 MED ORDER — INSULIN GLARGINE 100 UNIT/ML SOLOSTAR PEN: 50.0000 [IU] | PEN_INJECTOR | Freq: Every day | SUBCUTANEOUS | 5 refills | Status: DC

## 2019-12-20 MED ORDER — INSULIN ASPART FLEXPEN 100 UNIT/ML ~~LOC~~ SOPN: PEN_INJECTOR | SUBCUTANEOUS | 5 refills | Status: DC

## 2019-12-20 NOTE — Progress Notes (Signed)
Date:  12/20/2019   Name:  Darius Morrison   DOB:  Jun 21, 1975   MRN:  [id number]   Chief Complaint: Diabetes (follow up last reading 98 this morning )  Diabetes He presents for his follow-up diabetic visit. He has type 1 diabetes mellitus. His disease course has been stable. Pertinent negatives for hypoglycemia include no dizziness, headaches or tremors. Pertinent negatives for diabetes include no chest pain, no fatigue, no foot paresthesias, no polydipsia, no polyuria and no visual change. There are no hypoglycemic complications. Symptoms are stable. There are no diabetic complications. Current diabetic treatment includes insulin injections. He is compliant with treatment all of the time. His weight is stable. He is following a generally healthy diet. He participates in exercise three times a week. He monitors blood glucose at home 1-2 x per day. His breakfast blood glucose is taken between 6-7 am. His breakfast blood glucose range is generally 110-130 mg/dl. An ACE inhibitor/angiotensin II receptor blocker is being taken. Eye exam is current.  He finds that exercise helps control his glucoses.    Lab Results  Component Value Date   CREATININE 1.23 08/15/2019   BUN 14 08/15/2019   NA 144 08/15/2019   K 4.7 08/15/2019   CL 105 08/15/2019   CO2 25 08/15/2019   Lab Results  Component Value Date   CHOL 133 08/15/2019   HDL 66 08/15/2019   LDLCALC 51 08/15/2019   TRIG 84 08/15/2019   CHOLHDL 2.0 08/15/2019   Lab Results  Component Value Date   TSH 0.936 08/15/2019   Lab Results  Component Value Date   HGBA1C 7.2 (H) 08/15/2019   Lab Results  Component Value Date   WBC 8.2 08/15/2019   HGB 16.2 08/15/2019   HCT 47.7 08/15/2019   MCV 93 08/15/2019   PLT 203 08/15/2019   Lab Results  Component Value Date   ALT 23 08/15/2019   AST 22 08/15/2019   ALKPHOS 79 08/15/2019   BILITOT 0.5 08/15/2019     Review of Systems  Constitutional: Negative for appetite change,  fatigue and unexpected weight change.  Eyes: Negative for visual disturbance.  Respiratory: Negative for cough, shortness of breath and wheezing.   Cardiovascular: Negative for chest pain, palpitations and leg swelling.  Gastrointestinal: Negative for abdominal pain.  Endocrine: Negative for polydipsia and polyuria.  Genitourinary: Negative for dysuria and hematuria.  Skin: Negative for color change and rash.  Neurological: Negative for dizziness, tremors, numbness and headaches.  Psychiatric/Behavioral: Negative for dysphoric mood.    Patient Active Problem List   Diagnosis Date Noted  . Carpal tunnel syndrome on both sides 04/10/2019  . DM (diabetes mellitus), type 1 (HCC) 09/26/2017  . Hyperlipidemia LDL goal <100 09/26/2017  . Thyroid nodule 2017  . ED (erectile dysfunction) 03/13/2013  . OSA on CPAP 11/22/2011    No Known Allergies  Past Surgical History:  Procedure Laterality Date  . CYST REMOVAL TRUNK  2000    Social History   Tobacco Use  . Smoking status: Never Smoker  . Smokeless tobacco: Never Used  Vaping Use  . Vaping Use: Never assessed  Substance Use Topics  . Alcohol use: Yes    Comment: rarely  . Drug use: No     Medication list has been reviewed and updated.  Current Meds  Medication Sig  . aspirin EC 81 MG tablet Take 81 mg by mouth daily.  Marland Kitchen atorvastatin (LIPITOR) 10 MG tablet Take 1 tablet (10 mg total)  by mouth daily.  . Continuous Blood Gluc Sensor (FREESTYLE LIBRE 14 DAY SENSOR) MISC Place 1 each onto the skin every 14 (fourteen) days.  . Insulin Aspart FlexPen 100 UNIT/ML SOPN ADMINISTER 20 UNITS UNDER THE SKIN THREE TIMES DAILY WITH MEALS  . insulin glargine (LANTUS) 100 UNIT/ML Solostar Pen Inject 36 Units into the skin at bedtime.  Marland Kitchen lisinopril (ZESTRIL) 5 MG tablet Take 1 tablet (5 mg total) by mouth daily.  . NON FORMULARY CPAP nightly  . sildenafil (REVATIO) 20 MG tablet TAKE 1-5 TABLETS 1/2 TO 1 HOUR BEFORE SEX, DO NOT EXCEED 100  MG DAILY.    PHQ 2/9 Scores 08/15/2019 04/10/2019 01/09/2019  PHQ - 2 Score 0 0 0  PHQ- 9 Score 0 0 -    GAD 7 : Generalized Anxiety Score 08/15/2019  Nervous, Anxious, on Edge 0  Control/stop worrying 0  Worry too much - different things 0  Trouble relaxing 0  Restless 0  Easily annoyed or irritable 0  Afraid - awful might happen 0  Total GAD 7 Score 0  Anxiety Difficulty Not difficult at all    BP Readings from Last 3 Encounters:  12/20/19 110/78  08/15/19 104/78  04/10/19 116/78    Physical Exam Vitals and nursing note reviewed.  Constitutional:      General: He is not in acute distress.    Appearance: Normal appearance. He is well-developed.  HENT:     Head: Normocephalic and atraumatic.  Neck:     Vascular: No carotid bruit.  Cardiovascular:     Rate and Rhythm: Normal rate and regular rhythm.     Pulses: Normal pulses.  Pulmonary:     Effort: Pulmonary effort is normal. No respiratory distress.     Breath sounds: No wheezing or rhonchi.  Musculoskeletal:     Cervical back: Normal range of motion.     Right lower leg: No edema.     Left lower leg: No edema.  Lymphadenopathy:     Cervical: No cervical adenopathy.  Skin:    General: Skin is warm and dry.     Findings: No rash.  Neurological:     Mental Status: He is alert and oriented to person, place, and time.  Psychiatric:        Behavior: Behavior normal.        Thought Content: Thought content normal.     Wt Readings from Last 3 Encounters:  12/20/19 262 lb (118.8 kg)  08/15/19 259 lb (117.5 kg)  04/10/19 261 lb (118.4 kg)    BP 110/78   Pulse 77   Temp 98.1 F (36.7 C) (Oral)   Ht 6\' 1"  (1.854 m)   Wt 262 lb (118.8 kg)   SpO2 97%   BMI 34.57 kg/m   Assessment and Plan: 1. Type 1 diabetes mellitus with hyperglycemia (HCC) Clinically stable by exam and report without s/s of hypoglycemia. DM complicated by lipids. Tolerating medications - basal bolus insulin -  well without side effects  or other concerns. - Insulin Aspart FlexPen 100 UNIT/ML SOPN; ADMINISTER 34 UNITS UNDER THE SKIN THREE TIMES DAILY WITH MEALS  Dispense: 30 mL; Refill: 5 - insulin glargine (LANTUS) 100 UNIT/ML Solostar Pen; Inject 50 Units into the skin at bedtime.  Dispense: 15 mL; Refill: 5 - Hemoglobin A1c  2. Need for hepatitis C screening test - Hepatitis C antibody   Partially dictated using . Any errors are unintentional.  Animal nutritionist, MD [facility]  12/20/2019

## 2020-01-14 ENCOUNTER — Telehealth: Payer: Self-pay

## 2020-01-14 NOTE — Telephone Encounter (Signed)
Completed PA on [email or web address] for Insulin Aspart FlexPen 100unit/ml oen injectors.  Key: [id number]  Awaiting outcome from insurance.   CM

## 2020-02-04 DIAGNOSIS — M9902 Segmental and somatic dysfunction of thoracic region: Secondary | ICD-10-CM | POA: Diagnosis not present

## 2020-02-04 DIAGNOSIS — M531 Cervicobrachial syndrome: Secondary | ICD-10-CM | POA: Diagnosis not present

## 2020-02-04 DIAGNOSIS — M546 Pain in thoracic spine: Secondary | ICD-10-CM | POA: Diagnosis not present

## 2020-02-04 DIAGNOSIS — M9901 Segmental and somatic dysfunction of cervical region: Secondary | ICD-10-CM | POA: Diagnosis not present

## 2020-02-07 ENCOUNTER — Encounter: Payer: Self-pay | Admitting: Internal Medicine

## 2020-02-12 DIAGNOSIS — M9902 Segmental and somatic dysfunction of thoracic region: Secondary | ICD-10-CM | POA: Diagnosis not present

## 2020-02-12 DIAGNOSIS — M531 Cervicobrachial syndrome: Secondary | ICD-10-CM | POA: Diagnosis not present

## 2020-02-12 DIAGNOSIS — M9901 Segmental and somatic dysfunction of cervical region: Secondary | ICD-10-CM | POA: Diagnosis not present

## 2020-02-12 DIAGNOSIS — M546 Pain in thoracic spine: Secondary | ICD-10-CM | POA: Diagnosis not present

## 2020-02-25 DIAGNOSIS — M531 Cervicobrachial syndrome: Secondary | ICD-10-CM | POA: Diagnosis not present

## 2020-02-25 DIAGNOSIS — M9902 Segmental and somatic dysfunction of thoracic region: Secondary | ICD-10-CM | POA: Diagnosis not present

## 2020-02-25 DIAGNOSIS — M9901 Segmental and somatic dysfunction of cervical region: Secondary | ICD-10-CM | POA: Diagnosis not present

## 2020-02-25 DIAGNOSIS — M546 Pain in thoracic spine: Secondary | ICD-10-CM | POA: Diagnosis not present

## 2020-03-04 DIAGNOSIS — M546 Pain in thoracic spine: Secondary | ICD-10-CM | POA: Diagnosis not present

## 2020-03-04 DIAGNOSIS — M9902 Segmental and somatic dysfunction of thoracic region: Secondary | ICD-10-CM | POA: Diagnosis not present

## 2020-03-04 DIAGNOSIS — M9901 Segmental and somatic dysfunction of cervical region: Secondary | ICD-10-CM | POA: Diagnosis not present

## 2020-03-04 DIAGNOSIS — M531 Cervicobrachial syndrome: Secondary | ICD-10-CM | POA: Diagnosis not present

## 2020-03-11 DIAGNOSIS — M531 Cervicobrachial syndrome: Secondary | ICD-10-CM | POA: Diagnosis not present

## 2020-03-11 DIAGNOSIS — M9902 Segmental and somatic dysfunction of thoracic region: Secondary | ICD-10-CM | POA: Diagnosis not present

## 2020-03-11 DIAGNOSIS — M9901 Segmental and somatic dysfunction of cervical region: Secondary | ICD-10-CM | POA: Diagnosis not present

## 2020-03-11 DIAGNOSIS — M546 Pain in thoracic spine: Secondary | ICD-10-CM | POA: Diagnosis not present

## 2020-03-31 DIAGNOSIS — M9901 Segmental and somatic dysfunction of cervical region: Secondary | ICD-10-CM | POA: Diagnosis not present

## 2020-03-31 DIAGNOSIS — M9902 Segmental and somatic dysfunction of thoracic region: Secondary | ICD-10-CM | POA: Diagnosis not present

## 2020-03-31 DIAGNOSIS — M531 Cervicobrachial syndrome: Secondary | ICD-10-CM | POA: Diagnosis not present

## 2020-03-31 DIAGNOSIS — M546 Pain in thoracic spine: Secondary | ICD-10-CM | POA: Diagnosis not present

## 2020-04-09 DIAGNOSIS — M546 Pain in thoracic spine: Secondary | ICD-10-CM | POA: Diagnosis not present

## 2020-04-09 DIAGNOSIS — M9902 Segmental and somatic dysfunction of thoracic region: Secondary | ICD-10-CM | POA: Diagnosis not present

## 2020-04-09 DIAGNOSIS — M9901 Segmental and somatic dysfunction of cervical region: Secondary | ICD-10-CM | POA: Diagnosis not present

## 2020-04-09 DIAGNOSIS — M531 Cervicobrachial syndrome: Secondary | ICD-10-CM | POA: Diagnosis not present

## 2020-04-13 ENCOUNTER — Encounter: Payer: Self-pay | Admitting: Internal Medicine

## 2020-04-13 ENCOUNTER — Ambulatory Visit: Payer: BC Managed Care – PPO | Admitting: Internal Medicine

## 2020-04-13 ENCOUNTER — Other Ambulatory Visit: Payer: Self-pay

## 2020-04-13 VITALS — BP 102/78 | HR 87 | Temp 98.1°F | Ht 73.0 in | Wt 266.0 lb

## 2020-04-13 DIAGNOSIS — G4733 Obstructive sleep apnea (adult) (pediatric): Secondary | ICD-10-CM

## 2020-04-13 DIAGNOSIS — E1065 Type 1 diabetes mellitus with hyperglycemia: Secondary | ICD-10-CM

## 2020-04-13 DIAGNOSIS — Z9989 Dependence on other enabling machines and devices: Secondary | ICD-10-CM

## 2020-04-13 NOTE — Progress Notes (Signed)
Date:  04/13/2020   Name:  Darius Morrison   DOB:  [birth year]   MRN:  [id number]   Chief Complaint: Diabetes (Last blood sugar 48 this morning)  Diabetes He presents for his follow-up diabetic visit. He has type 1 diabetes mellitus. His disease course has been stable. Hypoglycemia symptoms include confusion and dizziness. Pertinent negatives for hypoglycemia include no nervousness/anxiousness. Pertinent negatives for diabetes include no chest pain, no fatigue, no foot paresthesias, no polydipsia and no polyuria. Symptoms are stable. Current diabetic treatment includes insulin injections. He is compliant with treatment all of the time. He is following a diabetic diet. When asked about meal planning, he reported none. His home blood glucose trend is fluctuating minimally. His breakfast blood glucose is taken between 6-7 am. His breakfast blood glucose range is generally 140-180 mg/dl. His dinner blood glucose is taken between 5-6 pm. His dinner blood glucose range is generally 130-140 mg/dl. An ACE inhibitor/angiotensin II receptor blocker is being taken. Eye exam is current.  OSA - his machine was recalled and has not been replaced - he was told it may be a year.  So he recently purchased one privately and is waiting for it to arrive.  He can tell the difference - mainly falls asleep more easily in the evening and he is snoring more loudly.  Lab Results  Component Value Date   CREATININE 1.23 08/15/2019   BUN 14 08/15/2019   NA 144 08/15/2019   K 4.7 08/15/2019   CL 105 08/15/2019   CO2 25 08/15/2019   Lab Results  Component Value Date   CHOL 133 08/15/2019   HDL 66 08/15/2019   LDLCALC 51 08/15/2019   TRIG 84 08/15/2019   CHOLHDL 2.0 08/15/2019   Lab Results  Component Value Date   TSH 0.936 08/15/2019   Lab Results  Component Value Date   HGBA1C 6.9 (H) 12/20/2019   Lab Results  Component Value Date   WBC 8.2 08/15/2019   HGB 16.2 08/15/2019   HCT 47.7 08/15/2019   MCV  93 08/15/2019   PLT 203 08/15/2019   Lab Results  Component Value Date   ALT 23 08/15/2019   AST 22 08/15/2019   ALKPHOS 79 08/15/2019   BILITOT 0.5 08/15/2019     Review of Systems  Constitutional: Negative for chills, fatigue and unexpected weight change.  Eyes: Negative for visual disturbance.  Respiratory: Negative for cough, chest tightness and shortness of breath.   Cardiovascular: Negative for chest pain, palpitations and leg swelling.  Gastrointestinal: Negative for abdominal pain.  Endocrine: Negative for polydipsia and polyuria.  Neurological: Positive for dizziness. Negative for numbness.  Psychiatric/Behavioral: Positive for confusion and sleep disturbance. Negative for dysphoric mood. Self-injury: has been without CPAP for 6 mo. The patient is not nervous/anxious.     Patient Active Problem List   Diagnosis Date Noted  . Carpal tunnel syndrome on both sides 04/10/2019  . DM (diabetes mellitus), type 1 (HCC) 09/26/2017  . Hyperlipidemia LDL goal <100 09/26/2017  . Thyroid nodule 2017  . ED (erectile dysfunction) 03/13/2013  . OSA on CPAP 11/22/2011    No Known Allergies  Past Surgical History:  Procedure Laterality Date  . CYST REMOVAL TRUNK  2000    Social History   Tobacco Use  . Smoking status: Never Smoker  . Smokeless tobacco: Never Used  Substance Use Topics  . Alcohol use: Yes    Comment: rarely  . Drug use: No  Medication list has been reviewed and updated.  Current Meds  Medication Sig  . aspirin EC 81 MG tablet Take 81 mg by mouth daily.  Marland Kitchen atorvastatin (LIPITOR) 10 MG tablet Take 1 tablet (10 mg total) by mouth daily.  . Continuous Blood Gluc Sensor (FREESTYLE LIBRE 14 DAY SENSOR) MISC Place 1 each onto the skin every 14 (fourteen) days.  . Insulin Aspart FlexPen 100 UNIT/ML SOPN ADMINISTER 34 UNITS UNDER THE SKIN THREE TIMES DAILY WITH MEALS  . insulin glargine (LANTUS) 100 UNIT/ML Solostar Pen Inject 50 Units into the skin at  bedtime.  Marland Kitchen lisinopril (ZESTRIL) 5 MG tablet Take 1 tablet (5 mg total) by mouth daily.  . NON FORMULARY CPAP nightly @ 7 cm H2O  . sildenafil (REVATIO) 20 MG tablet TAKE 1-5 TABLETS 1/2 TO 1 HOUR BEFORE SEX, DO NOT EXCEED 100 MG DAILY.    PHQ 2/9 Scores 04/13/2020 08/15/2019 04/10/2019 01/09/2019  PHQ - 2 Score 0 0 0 0  PHQ- 9 Score 0 0 0 -    GAD 7 : Generalized Anxiety Score 04/13/2020 08/15/2019  Nervous, Anxious, on Edge 0 0  Control/stop worrying 0 0  Worry too much - different things 0 0  Trouble relaxing 0 0  Restless 0 0  Easily annoyed or irritable 0 0  Afraid - awful might happen 0 0  Total GAD 7 Score 0 0  Anxiety Difficulty - Not difficult at all    BP Readings from Last 3 Encounters:  04/13/20 102/78  12/20/19 110/78  08/15/19 104/78    Physical Exam Vitals and nursing note reviewed.  Constitutional:      General: He is not in acute distress.    Appearance: He is well-developed.  HENT:     Head: Normocephalic and atraumatic.  Cardiovascular:     Rate and Rhythm: Normal rate and regular rhythm.     Pulses: Normal pulses.  Pulmonary:     Effort: Pulmonary effort is normal. No respiratory distress.     Breath sounds: No wheezing or rhonchi.  Musculoskeletal:        General: Normal range of motion.     Cervical back: Normal range of motion.     Right lower leg: No edema.     Left lower leg: No edema.  Lymphadenopathy:     Cervical: No cervical adenopathy.  Skin:    General: Skin is warm and dry.     Capillary Refill: Capillary refill takes less than 2 seconds.     Findings: No rash.  Neurological:     General: No focal deficit present.     Mental Status: He is alert and oriented to person, place, and time.  Psychiatric:        Mood and Affect: Mood and affect and mood normal.     Wt Readings from Last 3 Encounters:  04/13/20 266 lb (120.7 kg)  12/20/19 262 lb (118.8 kg)  08/15/19 259 lb (117.5 kg)    BP 102/78   Pulse 87   Temp 98.1 F (36.7  C) (Oral)   Ht 6\' 1"  (1.854 m)   Wt 266 lb (120.7 kg)   SpO2 98%   BMI 35.09 kg/m   Assessment and Plan: 1. Type 1 diabetes mellitus with hyperglycemia (HCC) BS are well controlled on insulin injections. He does have occasional low readings that he treats with eating or drinking He is using regularly with good results - Comprehensive metabolic panel - Hemoglobin A1c  2.  OSA on CPAP Resume therapy once new machine arrives - he should be able to set it to his current 7 cm H2O.   Partially dictated using Animal nutritionist. Any errors are unintentional.  Bari Edward, MD [facility]  04/13/2020

## 2020-04-14 LAB — HEMOGLOBIN A1C
Est. average glucose Bld gHb Est-mCnc: 148 mg/dL
Hgb A1c MFr Bld: 6.8 % — ABNORMAL HIGH (ref 4.8–5.6)

## 2020-04-14 LAB — COMPREHENSIVE METABOLIC PANEL
ALT: 23 IU/L (ref 0–44)
AST: 21 IU/L (ref 0–40)
Albumin/Globulin Ratio: 1.5 (ref 1.2–2.2)
Albumin: 4.1 g/dL (ref 4.0–5.0)
Alkaline Phosphatase: 74 IU/L (ref 44–121)
BUN/Creatinine Ratio: 14 (ref 9–20)
BUN: 15 mg/dL (ref 6–24)
Bilirubin Total: 0.3 mg/dL (ref 0.0–1.2)
CO2: 26 mmol/L (ref 20–29)
Calcium: 9.5 mg/dL (ref 8.7–10.2)
Chloride: 100 mmol/L (ref 96–106)
Creatinine, Ser: 1.09 mg/dL (ref 0.76–1.27)
GFR calc Af Amer: 94 mL/min/{1.73_m2} (ref >59)
GFR calc non Af Amer: 81 mL/min/{1.73_m2} (ref >59)
Globulin, Total: 2.8 g/dL (ref 1.5–4.5)
Glucose: 125 mg/dL — ABNORMAL HIGH (ref 65–99)
Potassium: 4.8 mmol/L (ref 3.5–5.2)
Sodium: 139 mmol/L (ref 134–144)
Total Protein: 6.9 g/dL (ref 6.0–8.5)

## 2020-04-15 DIAGNOSIS — M546 Pain in thoracic spine: Secondary | ICD-10-CM | POA: Diagnosis not present

## 2020-04-15 DIAGNOSIS — M531 Cervicobrachial syndrome: Secondary | ICD-10-CM | POA: Diagnosis not present

## 2020-04-15 DIAGNOSIS — M9901 Segmental and somatic dysfunction of cervical region: Secondary | ICD-10-CM | POA: Diagnosis not present

## 2020-04-15 DIAGNOSIS — M9902 Segmental and somatic dysfunction of thoracic region: Secondary | ICD-10-CM | POA: Diagnosis not present

## 2020-04-21 DIAGNOSIS — M9901 Segmental and somatic dysfunction of cervical region: Secondary | ICD-10-CM | POA: Diagnosis not present

## 2020-04-21 DIAGNOSIS — M9902 Segmental and somatic dysfunction of thoracic region: Secondary | ICD-10-CM | POA: Diagnosis not present

## 2020-04-21 DIAGNOSIS — M531 Cervicobrachial syndrome: Secondary | ICD-10-CM | POA: Diagnosis not present

## 2020-04-21 DIAGNOSIS — M546 Pain in thoracic spine: Secondary | ICD-10-CM | POA: Diagnosis not present

## 2020-05-06 DIAGNOSIS — M531 Cervicobrachial syndrome: Secondary | ICD-10-CM | POA: Diagnosis not present

## 2020-05-06 DIAGNOSIS — M9902 Segmental and somatic dysfunction of thoracic region: Secondary | ICD-10-CM | POA: Diagnosis not present

## 2020-05-06 DIAGNOSIS — M9901 Segmental and somatic dysfunction of cervical region: Secondary | ICD-10-CM | POA: Diagnosis not present

## 2020-05-06 DIAGNOSIS — M546 Pain in thoracic spine: Secondary | ICD-10-CM | POA: Diagnosis not present

## 2020-05-13 DIAGNOSIS — M531 Cervicobrachial syndrome: Secondary | ICD-10-CM | POA: Diagnosis not present

## 2020-05-13 DIAGNOSIS — M9902 Segmental and somatic dysfunction of thoracic region: Secondary | ICD-10-CM | POA: Diagnosis not present

## 2020-05-13 DIAGNOSIS — M546 Pain in thoracic spine: Secondary | ICD-10-CM | POA: Diagnosis not present

## 2020-05-13 DIAGNOSIS — M9901 Segmental and somatic dysfunction of cervical region: Secondary | ICD-10-CM | POA: Diagnosis not present

## 2020-06-10 ENCOUNTER — Telehealth: Payer: Self-pay

## 2020-06-10 DIAGNOSIS — E109 Type 1 diabetes mellitus without complications: Secondary | ICD-10-CM

## 2020-06-10 MED ORDER — INSULIN GLARGINE 100 UNIT/ML ~~LOC~~ SOLN: 50.0000 [IU] | Freq: Every day | SUBCUTANEOUS | 5 refills | Status: DC

## 2020-06-10 MED ORDER — ATORVASTATIN CALCIUM 10 MG PO TABS: 10.0000 mg | ORAL_TABLET | Freq: Every day | ORAL | 0 refills | Status: DC

## 2020-06-10 MED ORDER — LISINOPRIL 5 MG PO TABS: 5.0000 mg | ORAL_TABLET | Freq: Every day | ORAL | 0 refills | Status: DC

## 2020-06-10 NOTE — Telephone Encounter (Signed)
Called patient to clarify RX's since we received a denial PA request for Novolog and Lantus. Patient said he received a letter in the mail that said his Lantus is going to be replaced with Semglee but his Novolog will stay the same.  Sent in refills for Semglee, Atorvastatin, and Lisinopril to [facility] in [place]. Patient aware.  Mariel Sleet, CMA (AAMA)

## 2020-08-18 ENCOUNTER — Encounter: Payer: BC Managed Care – PPO | Admitting: Internal Medicine

## 2020-09-02 ENCOUNTER — Encounter: Payer: Self-pay | Admitting: Internal Medicine

## 2020-09-02 ENCOUNTER — Ambulatory Visit: Payer: BC Managed Care – PPO | Admitting: Internal Medicine

## 2020-09-02 ENCOUNTER — Other Ambulatory Visit: Payer: Self-pay

## 2020-09-02 VITALS — BP 118/82 | HR 82 | Temp 98.9°F | Ht 73.0 in | Wt 271.0 lb

## 2020-09-02 DIAGNOSIS — E041 Nontoxic single thyroid nodule: Secondary | ICD-10-CM

## 2020-09-02 DIAGNOSIS — N529 Male erectile dysfunction, unspecified: Secondary | ICD-10-CM

## 2020-09-02 DIAGNOSIS — E1069 Type 1 diabetes mellitus with other specified complication: Secondary | ICD-10-CM | POA: Diagnosis not present

## 2020-09-02 DIAGNOSIS — E785 Hyperlipidemia, unspecified: Secondary | ICD-10-CM

## 2020-09-02 LAB — POCT GLYCOSYLATED HEMOGLOBIN (HGB A1C): Hemoglobin A1C: 6.9 % — AB (ref 4.0–5.6)

## 2020-09-02 MED ORDER — INSULIN GLARGINE 100 UNIT/ML ~~LOC~~ SOLN
38.0000 [IU] | Freq: Every day | SUBCUTANEOUS | 5 refills | Status: DC
Start: 2020-09-02 — End: 2020-09-03

## 2020-09-02 MED ORDER — ATORVASTATIN CALCIUM 10 MG PO TABS: 10.0000 mg | ORAL_TABLET | Freq: Every day | ORAL | 1 refills | Status: DC

## 2020-09-02 MED ORDER — SILDENAFIL CITRATE 20 MG PO TABS: 20.0000 mg | ORAL_TABLET | Freq: Every day | ORAL | 1 refills | Status: DC | PRN

## 2020-09-02 MED ORDER — LISINOPRIL 5 MG PO TABS: 5.0000 mg | ORAL_TABLET | Freq: Every day | ORAL | 1 refills | Status: DC

## 2020-09-02 MED ORDER — INSULIN ASPART FLEXPEN 100 UNIT/ML ~~LOC~~ SOPN: PEN_INJECTOR | SUBCUTANEOUS | 5 refills | Status: DC

## 2020-09-02 NOTE — Progress Notes (Signed)
Date:  09/02/2020   Name:  Darius Morrison   DOB:  1976/01/10   MRN:  [id number]   Chief Complaint: Diabetes (Fasting 80)  Diabetes He presents for his follow-up diabetic visit. He has type 1 diabetes mellitus. Pertinent negatives for hypoglycemia include no headaches or tremors. Pertinent negatives for diabetes include no chest pain, no fatigue, no polydipsia and no polyuria. Current diabetic treatment includes insulin injections. He is compliant with treatment all of the time. His weight is stable.    Lab Results  Component Value Date   CREATININE 1.09 04/13/2020   BUN 15 04/13/2020   NA 139 04/13/2020   K 4.8 04/13/2020   CL 100 04/13/2020   CO2 26 04/13/2020   Lab Results  Component Value Date   CHOL 133 08/15/2019   HDL 66 08/15/2019   LDLCALC 51 08/15/2019   TRIG 84 08/15/2019   CHOLHDL 2.0 08/15/2019   Lab Results  Component Value Date   TSH 0.936 08/15/2019   Lab Results  Component Value Date   HGBA1C 6.9 (A) 09/02/2020   Lab Results  Component Value Date   WBC 8.2 08/15/2019   HGB 16.2 08/15/2019   HCT 47.7 08/15/2019   MCV 93 08/15/2019   PLT 203 08/15/2019   Lab Results  Component Value Date   ALT 23 04/13/2020   AST 21 04/13/2020   ALKPHOS 74 04/13/2020   BILITOT 0.3 04/13/2020     Review of Systems  Constitutional: Negative for appetite change, fatigue and unexpected weight change.  HENT: Negative for trouble swallowing.   Eyes: Negative for visual disturbance.  Respiratory: Negative for cough, shortness of breath and wheezing.   Cardiovascular: Negative for chest pain, palpitations and leg swelling.  Gastrointestinal: Negative for abdominal pain and blood in stool.  Endocrine: Negative for polydipsia and polyuria.  Genitourinary: Negative for dysuria and hematuria.  Musculoskeletal: Negative for arthralgias and gait problem.  Skin: Negative for color change and rash.  Neurological: Negative for tremors, numbness and headaches.   Psychiatric/Behavioral: Negative for dysphoric mood.    Patient Active Problem List   Diagnosis Date Noted  . Type 1 diabetes mellitus with other specified complication (HCC) 09/02/2020  . Carpal tunnel syndrome on both sides 04/10/2019  . Hyperlipidemia LDL goal <100 09/26/2017  . Thyroid nodule 2017  . ED (erectile dysfunction) 03/13/2013  . OSA on CPAP 11/22/2011    No Known Allergies  Past Surgical History:  Procedure Laterality Date  . CYST REMOVAL TRUNK  2000    Social History   Tobacco Use  . Smoking status: Never Smoker  . Smokeless tobacco: Never Used  Substance Use Topics  . Alcohol use: Yes    Comment: rarely  . Drug use: No     Medication list has been reviewed and updated.  Current Meds  Medication Sig  . aspirin EC 81 MG tablet Take 81 mg by mouth daily.  . Continuous Blood Gluc Sensor (FREESTYLE LIBRE 14 DAY SENSOR) MISC Place 1 each onto the skin every 14 (fourteen) days.  . NON FORMULARY CPAP nightly @ 7 cm H2O  . [DISCONTINUED] atorvastatin (LIPITOR) 10 MG tablet Take 1 tablet (10 mg total) by mouth daily.  . [DISCONTINUED] Insulin Aspart FlexPen 100 UNIT/ML SOPN ADMINISTER 34 UNITS UNDER THE SKIN THREE TIMES DAILY WITH MEALS  . [DISCONTINUED] Insulin Aspart Prot & Aspart (NOVOLOG 70/30 FLEXPEN RELION [place]) Inject 15-20 Units into the skin in the morning, at noon, and at bedtime.  . [  DISCONTINUED] insulin glargine (LANTUS) 100 UNIT/ML injection Inject 38 Units into the skin daily.  . [DISCONTINUED] lisinopril (ZESTRIL) 5 MG tablet Take 1 tablet (5 mg total) by mouth daily.    PHQ 2/9 Scores 09/02/2020 04/13/2020 08/15/2019 04/10/2019  PHQ - 2 Score 1 0 0 0  PHQ- 9 Score 3 0 0 0    GAD 7 : Generalized Anxiety Score 09/02/2020 04/13/2020 08/15/2019  Nervous, Anxious, on Edge 0 0 0  Control/stop worrying 0 0 0  Worry too much - different things 0 0 0  Trouble relaxing 0 0 0  Restless 0 0 0  Easily annoyed or irritable 0 0 0  Afraid - awful might  happen 0 0 0  Total GAD 7 Score 0 0 0  Anxiety Difficulty Not difficult at all - Not difficult at all    BP Readings from Last 3 Encounters:  09/02/20 118/82  04/13/20 102/78  12/20/19 110/78    Physical Exam Vitals and nursing note reviewed.  Constitutional:      General: He is not in acute distress.    Appearance: Normal appearance. He is well-developed.  HENT:     Head: Normocephalic and atraumatic.  Neck:     Vascular: No carotid bruit.  Cardiovascular:     Rate and Rhythm: Normal rate and regular rhythm.     Pulses: Normal pulses.     Heart sounds: No murmur heard.   Pulmonary:     Effort: Pulmonary effort is normal. No respiratory distress.     Breath sounds: No wheezing or rhonchi.  Musculoskeletal:     Cervical back: Normal range of motion and neck supple. No tenderness.     Right lower leg: No edema.     Left lower leg: No edema.  Lymphadenopathy:     Cervical: No cervical adenopathy.  Skin:    General: Skin is warm and dry.     Findings: No rash.  Neurological:     Mental Status: He is alert and oriented to person, place, and time.  Psychiatric:        Mood and Affect: Mood normal.        Behavior: Behavior normal.     Wt Readings from Last 3 Encounters:  09/02/20 271 lb (122.9 kg)  04/13/20 266 lb (120.7 kg)  12/20/19 262 lb (118.8 kg)    BP 118/82   Pulse 82   Temp 98.9 F (37.2 C) (Oral)   Ht 6\' 1"  (1.854 m)   Wt 271 lb (122.9 kg)   SpO2 96%   BMI 35.75 kg/m   Assessment and Plan: 1. Type 1 diabetes mellitus with other specified complication (HCC) Clinically stable by exam and report without s/s of hypoglycemia. DM complicated by lipids. Tolerating medications - insulin-  well without side effects or other concerns. - POCT glycosylated hemoglobin (Hb A1C) stable at 6.9 - lisinopril (ZESTRIL) 5 MG tablet; Take 1 tablet (5 mg total) by mouth daily.  Dispense: 90 tablet; Refill: 1  2. Hyperlipidemia LDL goal <100 Tolerating statin  medication without side effects at this time LDL is at goal of < 70 on current dose Continue same therapy without change at this time. - atorvastatin (LIPITOR) 10 MG tablet; Take 1 tablet (10 mg total) by mouth daily.  Dispense: 90 tablet; Refill: 1  3. Thyroid nodule Remains asx No further imaging needed at this time.   Partially dictated using . Any errors are unintentional.  Animal nutritionist, MD [facility]  09/02/2020

## 2020-09-03 ENCOUNTER — Other Ambulatory Visit: Payer: Self-pay

## 2020-09-03 MED ORDER — INSULIN GLARGINE 100 UNIT/ML ~~LOC~~ SOLN: 50.0000 [IU] | Freq: Every day | SUBCUTANEOUS | 5 refills | Status: DC

## 2020-09-03 NOTE — Progress Notes (Signed)
Lantus not covered by patients insurance, Semglee is alternative.

## 2020-09-21 ENCOUNTER — Other Ambulatory Visit: Payer: Self-pay | Admitting: Internal Medicine

## 2020-09-21 ENCOUNTER — Encounter: Payer: Self-pay | Admitting: Internal Medicine

## 2020-09-21 ENCOUNTER — Other Ambulatory Visit: Payer: Self-pay

## 2020-09-21 DIAGNOSIS — E109 Type 1 diabetes mellitus without complications: Secondary | ICD-10-CM

## 2020-09-22 ENCOUNTER — Other Ambulatory Visit: Payer: Self-pay

## 2020-09-22 MED ORDER — DEXCOM G6 TRANSMITTER MISC
3 refills | Status: DC
Start: 2020-09-22 — End: 2021-01-20

## 2020-09-22 MED ORDER — DEXCOM G6 SENSOR MISC
3 refills | Status: DC
Start: 2020-09-22 — End: 2021-01-20

## 2020-11-19 ENCOUNTER — Encounter: Payer: Self-pay | Admitting: Internal Medicine

## 2020-11-20 ENCOUNTER — Other Ambulatory Visit: Payer: Self-pay

## 2020-11-20 MED ORDER — INSULIN GLARGINE 100 UNIT/ML SOLOSTAR PEN: 36.0000 [IU] | PEN_INJECTOR | Freq: Every day | SUBCUTANEOUS | 2 refills | Status: DC

## 2021-01-20 ENCOUNTER — Other Ambulatory Visit: Payer: Self-pay

## 2021-01-20 ENCOUNTER — Ambulatory Visit (INDEPENDENT_AMBULATORY_CARE_PROVIDER_SITE_OTHER): Payer: No Typology Code available for payment source | Admitting: Internal Medicine

## 2021-01-20 ENCOUNTER — Encounter: Payer: Self-pay | Admitting: Internal Medicine

## 2021-01-20 VITALS — BP 104/68 | HR 85 | Ht 73.0 in | Wt 262.0 lb

## 2021-01-20 DIAGNOSIS — E1065 Type 1 diabetes mellitus with hyperglycemia: Secondary | ICD-10-CM

## 2021-01-20 DIAGNOSIS — Z1211 Encounter for screening for malignant neoplasm of colon: Secondary | ICD-10-CM | POA: Diagnosis not present

## 2021-01-20 DIAGNOSIS — E785 Hyperlipidemia, unspecified: Secondary | ICD-10-CM | POA: Diagnosis not present

## 2021-01-20 DIAGNOSIS — E1069 Type 1 diabetes mellitus with other specified complication: Secondary | ICD-10-CM

## 2021-01-20 DIAGNOSIS — Z23 Encounter for immunization: Secondary | ICD-10-CM

## 2021-01-20 DIAGNOSIS — Z125 Encounter for screening for malignant neoplasm of prostate: Secondary | ICD-10-CM | POA: Diagnosis not present

## 2021-01-20 DIAGNOSIS — Z0000 Encounter for general adult medical examination without abnormal findings: Secondary | ICD-10-CM

## 2021-01-20 LAB — POCT UA - MICROALBUMIN: Microalbumin Ur, POC: NEGATIVE mg/L

## 2021-01-20 MED ORDER — HUMALOG KWIKPEN 200 UNIT/ML ~~LOC~~ SOPN: 34.0000 [IU] | PEN_INJECTOR | Freq: Three times a day (TID) | SUBCUTANEOUS | 5 refills | Status: DC

## 2021-01-20 MED ORDER — INSULIN ASPART FLEXPEN 100 UNIT/ML ~~LOC~~ SOPN: PEN_INJECTOR | SUBCUTANEOUS | 5 refills | Status: DC

## 2021-01-20 MED ORDER — FREESTYLE LIBRE 2 SENSOR MISC: 1.0000 | 5 refills | Status: DC

## 2021-01-20 NOTE — Progress Notes (Signed)
Date:  01/20/2021   Name:  Darius Morrison   DOB:  07/25/1975   MRN:  [id number]   Chief Complaint: Annual Exam Darius Morrison is a 45 y.o. male who presents today for his Complete Annual Exam. He feels well. He reports exercising - walking. He reports he is sleeping well.    Eye Exam: See's Dr [facility] in [place] Colonoscopy: none  Immunization History  Administered Date(s) Administered   Influenza, Seasonal, Injecte, Preservative Fre 03/05/2018   Influenza,inj,Quad PF,6+ Mos 01/16/2019   Influenza-Unspecified 03/02/2020   PFIZER(Purple Top)SARS-COV-2 Vaccination 07/09/2019, 08/06/2019   Pneumococcal Polysaccharide-23 03/13/2014   Tdap 09/10/2012     Diabetes He presents for his follow-up diabetic visit. He has type 1 diabetes mellitus. His disease course has been stable. Pertinent negatives for hypoglycemia include no dizziness, headaches or nervousness/anxiousness. Pertinent negatives for diabetes include no chest pain and no fatigue. Current diabetic treatment includes insulin injections. He is following a generally healthy diet. An ACE inhibitor/angiotensin II receptor blocker is being taken. Eye exam is current.  Hyperlipidemia This is a chronic problem. The problem is controlled. Pertinent negatives include no chest pain, myalgias or shortness of breath. Current antihyperlipidemic treatment includes statins. The current treatment provides significant improvement of lipids.   Lab Results  Component Value Date   CREATININE 1.09 04/13/2020   BUN 15 04/13/2020   NA 139 04/13/2020   K 4.8 04/13/2020   CL 100 04/13/2020   CO2 26 04/13/2020   Lab Results  Component Value Date   CHOL 133 08/15/2019   HDL 66 08/15/2019   LDLCALC 51 08/15/2019   TRIG 84 08/15/2019   CHOLHDL 2.0 08/15/2019   Lab Results  Component Value Date   TSH 0.936 08/15/2019   Lab Results  Component Value Date   HGBA1C 6.9 (A) 09/02/2020   Lab Results   Component Value Date   WBC 8.2 08/15/2019   HGB 16.2 08/15/2019   HCT 47.7 08/15/2019   MCV 93 08/15/2019   PLT 203 08/15/2019   Lab Results  Component Value Date   ALT 23 04/13/2020   AST 21 04/13/2020   ALKPHOS 74 04/13/2020   BILITOT 0.3 04/13/2020     Review of Systems  Constitutional:  Negative for appetite change, chills, diaphoresis, fatigue and unexpected weight change.  HENT:  Negative for hearing loss, tinnitus, trouble swallowing and voice change.   Eyes:  Negative for visual disturbance.  Respiratory:  Negative for choking, shortness of breath and wheezing.   Cardiovascular:  Negative for chest pain, palpitations and leg swelling.  Gastrointestinal:  Negative for abdominal pain, blood in stool, constipation and diarrhea.  Genitourinary:  Negative for difficulty urinating, dysuria and frequency.  Musculoskeletal:  Negative for arthralgias, back pain and myalgias.  Skin:  Negative for color change and rash.  Neurological:  Negative for dizziness, syncope and headaches.  Hematological:  Negative for adenopathy.  Psychiatric/Behavioral:  Negative for dysphoric mood and sleep disturbance. The patient is not nervous/anxious.    Patient Active Problem List   Diagnosis Date Noted   Type 1 diabetes mellitus with other specified complication (HCC) 09/02/2020   Carpal tunnel syndrome on both sides 04/10/2019   Hyperlipidemia LDL goal <100 09/26/2017   Thyroid nodule 2017   ED (erectile dysfunction) 03/13/2013   OSA on CPAP 11/22/2011    No Known Allergies  Past Surgical History:  Procedure Laterality Date   CYST REMOVAL TRUNK  2000    Social  History   Tobacco Use   Smoking status: Never   Smokeless tobacco: Never  Substance Use Topics   Alcohol use: Yes    Comment: rarely   Drug use: No     Medication list has been reviewed and updated.  Current Meds  Medication Sig   aspirin EC 81 MG tablet Take 81 mg by mouth daily.   atorvastatin (LIPITOR) 10 MG  tablet Take 1 tablet (10 mg total) by mouth daily.   Insulin Aspart FlexPen 100 UNIT/ML SOPN ADMINISTER 34 UNITS UNDER THE SKIN THREE TIMES DAILY WITH MEALS   insulin glargine (LANTUS) 100 UNIT/ML Solostar Pen Inject 36 Units into the skin at bedtime. (Patient taking differently: Inject 50 Units into the skin at bedtime.)   lisinopril (ZESTRIL) 5 MG tablet Take 1 tablet (5 mg total) by mouth daily.   NON FORMULARY CPAP nightly @ 7 cm H2O   sildenafil (REVATIO) 20 MG tablet Take 1 tablet (20 mg total) by mouth daily as needed.    PHQ 2/9 Scores 01/20/2021 09/02/2020 04/13/2020 08/15/2019  PHQ - 2 Score 0 1 0 0  PHQ- 9 Score 1 3 0 0    GAD 7 : Generalized Anxiety Score 01/20/2021 09/02/2020 04/13/2020 08/15/2019  Nervous, Anxious, on Edge 0 0 0 0  Control/stop worrying 0 0 0 0  Worry too much - different things 0 0 0 0  Trouble relaxing 0 0 0 0  Restless 0 0 0 0  Easily annoyed or irritable 0 0 0 0  Afraid - awful might happen 0 0 0 0  Total GAD 7 Score 0 0 0 0  Anxiety Difficulty Not difficult at all Not difficult at all - Not difficult at all    BP Readings from Last 3 Encounters:  01/20/21 104/68  09/02/20 118/82  04/13/20 102/78    Physical Exam Vitals and nursing note reviewed.  Constitutional:      Appearance: Normal appearance. He is well-developed.  HENT:     Head: Normocephalic.     Right Ear: Tympanic membrane, ear canal and external ear normal.     Left Ear: Tympanic membrane, ear canal and external ear normal.     Nose: Nose normal.  Eyes:     Conjunctiva/sclera: Conjunctivae normal.     Pupils: Pupils are equal, round, and reactive to light.  Neck:     Thyroid: No thyromegaly.     Vascular: No carotid bruit.  Cardiovascular:     Rate and Rhythm: Normal rate and regular rhythm.     Heart sounds: Normal heart sounds.  Pulmonary:     Effort: Pulmonary effort is normal.     Breath sounds: Normal breath sounds. No wheezing.  Chest:  Breasts:    Right: No mass.      Left: No mass.  Abdominal:     General: Bowel sounds are normal.     Palpations: Abdomen is soft.     Tenderness: There is no abdominal tenderness.  Musculoskeletal:        General: Normal range of motion.     Cervical back: Normal range of motion and neck supple.  Lymphadenopathy:     Cervical: No cervical adenopathy.  Skin:    General: Skin is warm and dry.  Neurological:     Mental Status: He is alert and oriented to person, place, and time.     Deep Tendon Reflexes: Reflexes are normal and symmetric.  Psychiatric:        Attention and  Perception: Attention normal.        Mood and Affect: Mood normal.        Thought Content: Thought content normal.    Wt Readings from Last 3 Encounters:  01/20/21 262 lb (118.8 kg)  09/02/20 271 lb (122.9 kg)  04/13/20 266 lb (120.7 kg)    BP 104/68   Pulse 85   Ht 6\' 1"  (1.854 m)   Wt 262 lb (118.8 kg)   SpO2 98%   BMI 34.57 kg/m   Assessment and Plan: 1. Annual physical exam Exam is normal except for weight. Encourage regular exercise and appropriate dietary changes.  2. Colon cancer screening He is a candidate but declines for now. Options discussed.  3. Prostate cancer screening DRE deferred - PSA  4. Type 1 diabetes mellitus with other specified complication (HCC) BS has been well controlled.  Improved with use of Free Style libre. Insurance changes requires Humalog rather than Novolog. Eye exam up to date - will get notes. - CBC with Differential/Platelet - Comprehensive metabolic panel - Hemoglobin A1c - POCT UA - Microalbumin - insulin lispro (HUMALOG KWIKPEN) 200 UNIT/ML KwikPen; Inject 34 Units into the skin 3 (three) times daily before meals.  Dispense: 30 mL; Refill: 5 - Continuous Blood Gluc Sensor (FREESTYLE LIBRE 2 SENSOR) MISC; Place 1 each onto the skin every 14 (fourteen) days.  Dispense: 2 each; Refill: 5  5. Hyperlipidemia LDL goal <100 Tolerating statin medication without side effects at this time LDL  is at goal of < 70 on current dose Continue same therapy without change at this time. - Lipid panel   Partially dictated using . Any errors are unintentional.  Animal nutritionist, MD [facility]  01/20/2021

## 2021-01-21 ENCOUNTER — Other Ambulatory Visit: Payer: Self-pay

## 2021-01-21 LAB — CBC WITH DIFFERENTIAL/PLATELET
Basophils Absolute: 0.1 10*3/uL (ref 0.0–0.2)
Basos: 1 %
EOS (ABSOLUTE): 0.3 10*3/uL (ref 0.0–0.4)
Eos: 4 %
Hematocrit: 46.1 % (ref 37.5–51.0)
Hemoglobin: 16.2 g/dL (ref 13.0–17.7)
Immature Grans (Abs): 0.1 10*3/uL (ref 0.0–0.1)
Immature Granulocytes: 1 %
Lymphocytes Absolute: 2 10*3/uL (ref 0.7–3.1)
Lymphs: 30 %
MCH: 31.6 pg (ref 26.6–33.0)
MCHC: 35.1 g/dL (ref 31.5–35.7)
MCV: 90 fL (ref 79–97)
Monocytes Absolute: 0.5 10*3/uL (ref 0.1–0.9)
Monocytes: 8 %
Neutrophils Absolute: 3.7 10*3/uL (ref 1.4–7.0)
Neutrophils: 56 %
Platelets: 170 10*3/uL (ref 150–450)
RBC: 5.13 x10E6/uL (ref 4.14–5.80)
RDW: 11.9 % (ref 11.6–15.4)
WBC: 6.6 10*3/uL (ref 3.4–10.8)

## 2021-01-21 LAB — COMPREHENSIVE METABOLIC PANEL
ALT: 26 IU/L (ref 0–44)
AST: 23 IU/L (ref 0–40)
Albumin/Globulin Ratio: 1.7 (ref 1.2–2.2)
Albumin: 4.4 g/dL (ref 4.0–5.0)
Alkaline Phosphatase: 75 IU/L (ref 44–121)
BUN/Creatinine Ratio: 13 (ref 9–20)
BUN: 14 mg/dL (ref 6–24)
Bilirubin Total: 0.5 mg/dL (ref 0.0–1.2)
CO2: 24 mmol/L (ref 20–29)
Calcium: 9.7 mg/dL (ref 8.7–10.2)
Chloride: 100 mmol/L (ref 96–106)
Creatinine, Ser: 1.07 mg/dL (ref 0.76–1.27)
Globulin, Total: 2.6 g/dL (ref 1.5–4.5)
Glucose: 203 mg/dL — ABNORMAL HIGH (ref 65–99)
Potassium: 4.9 mmol/L (ref 3.5–5.2)
Sodium: 137 mmol/L (ref 134–144)
Total Protein: 7 g/dL (ref 6.0–8.5)
eGFR: 86 mL/min/{1.73_m2} (ref >59)

## 2021-01-21 LAB — HEMOGLOBIN A1C
Est. average glucose Bld gHb Est-mCnc: 160 mg/dL
Hgb A1c MFr Bld: 7.2 % — ABNORMAL HIGH (ref 4.8–5.6)

## 2021-01-21 LAB — LIPID PANEL
Chol/HDL Ratio: 2 ratio (ref 0.0–5.0)
Cholesterol, Total: 125 mg/dL (ref 100–199)
HDL: 62 mg/dL (ref >39)
LDL Chol Calc (NIH): 54 mg/dL (ref 0–99)
Triglycerides: 35 mg/dL (ref 0–149)
VLDL Cholesterol Cal: 9 mg/dL (ref 5–40)

## 2021-01-21 LAB — PSA: Prostate Specific Ag, Serum: 0.5 ng/mL (ref 0.0–4.0)

## 2021-01-21 MED ORDER — HUMALOG KWIKPEN 200 UNIT/ML ~~LOC~~ SOPN: 34.0000 [IU] | PEN_INJECTOR | Freq: Three times a day (TID) | SUBCUTANEOUS | 5 refills | Status: DC

## 2021-01-22 ENCOUNTER — Encounter: Payer: Self-pay | Admitting: Internal Medicine

## 2021-01-22 ENCOUNTER — Other Ambulatory Visit: Payer: Self-pay | Admitting: Internal Medicine

## 2021-01-22 DIAGNOSIS — E1065 Type 1 diabetes mellitus with hyperglycemia: Secondary | ICD-10-CM

## 2021-01-22 MED ORDER — INSULIN LISPRO (1 UNIT DIAL) 100 UNIT/ML (KWIKPEN): 34.0000 [IU] | PEN_INJECTOR | Freq: Three times a day (TID) | SUBCUTANEOUS | 5 refills | Status: DC

## 2021-01-26 ENCOUNTER — Telehealth: Payer: Self-pay

## 2021-01-26 NOTE — Telephone Encounter (Signed)
Completed PA on [email or web address] per patient. Submited patient information for insurance. Awaiting response.   PA ID # [id number]

## 2021-03-20 ENCOUNTER — Other Ambulatory Visit: Payer: Self-pay | Admitting: Internal Medicine

## 2021-03-20 DIAGNOSIS — E785 Hyperlipidemia, unspecified: Secondary | ICD-10-CM

## 2021-03-20 DIAGNOSIS — E1069 Type 1 diabetes mellitus with other specified complication: Secondary | ICD-10-CM

## 2021-03-20 NOTE — Telephone Encounter (Signed)
Requested Prescriptions  Pending Prescriptions Disp Refills  . atorvastatin (LIPITOR) 10 MG tablet [Pharmacy Med Name: ATORVASTATIN CALCIUM 10 MG TAB] 90 tablet 0    Sig: TAKE (1) TABLET BY MOUTH EVERY DAY     Cardiovascular:  Antilipid - Statins Passed - 03/20/2021  8:47 AM      Passed - Total Cholesterol in normal range and within 360 days    Cholesterol, Total  Date Value Ref Range Status  01/20/2021 125 100 - 199 mg/dL Final         Passed - LDL in normal range and within 360 days    LDL Chol Calc (NIH)  Date Value Ref Range Status  01/20/2021 54 0 - 99 mg/dL Final         Passed - HDL in normal range and within 360 days    HDL  Date Value Ref Range Status  01/20/2021 62 >39 mg/dL Final         Passed - Triglycerides in normal range and within 360 days    Triglycerides  Date Value Ref Range Status  01/20/2021 35 0 - 149 mg/dL Final         Passed - Patient is not pregnant      Passed - Valid encounter within last 12 months    Recent Outpatient Visits          1 month ago Annual physical exam   [facility] Reubin [facility], [facility]   6 months ago Type 1 diabetes mellitus with other specified complication [facility])   [facility] Reubin Milan, MD   11 months ago Type 1 diabetes mellitus with hyperglycemia [facility])   [facility] Reubin Milan, MD   1 year ago Type 1 diabetes mellitus with hyperglycemia [facility])   [facility] Reubin Milan, MD   1 year ago Annual physical exam   [facility] Reubin Milan, MD      Future Appointments            In 2 months Judithann Graves Nyoka Cowden, MD [facility]           . lisinopril (ZESTRIL) 5 MG tablet [Pharmacy Med Name: LISINOPRIL 5 MG TAB] 90 tablet 0    Sig: TAKE (1) TABLET BY MOUTH EVERY DAY     Cardiovascular:  ACE Inhibitors Passed - 03/20/2021  8:47 AM      Passed - Cr in normal range and within 180 days    Creatinine, Ser  Date Value Ref Range  Status  01/20/2021 1.07 0.76 - 1.27 mg/dL Final         Passed - K in normal range and within 180 days    Potassium  Date Value Ref Range Status  01/20/2021 4.9 3.5 - 5.2 mmol/L Final         Passed - Patient is not pregnant      Passed - Last BP in normal range    BP Readings from Last 1 Encounters:  01/20/21 104/68         Passed - Valid encounter within last 6 months    Recent Outpatient Visits          1 month ago Annual physical exam   [facility] Reubin Milan, MD   6 months ago Type 1 diabetes mellitus with other specified complication [facility])   [facility] Reubin Milan, MD   11 months ago Type 1 diabetes  mellitus with hyperglycemia [facility])   [facility] Reubin Milan, MD   1 year ago Type 1 diabetes mellitus with hyperglycemia [facility])   [facility] Reubin Milan, MD   1 year ago Annual physical exam   [facility] Reubin Milan, MD      Future Appointments            In 2 months Judithann Graves Nyoka Cowden, MD [facility]

## 2021-04-28 ENCOUNTER — Encounter: Payer: Self-pay | Admitting: Internal Medicine

## 2021-04-28 ENCOUNTER — Ambulatory Visit (INDEPENDENT_AMBULATORY_CARE_PROVIDER_SITE_OTHER): Payer: No Typology Code available for payment source | Admitting: Internal Medicine

## 2021-04-28 ENCOUNTER — Other Ambulatory Visit: Payer: Self-pay

## 2021-04-28 ENCOUNTER — Ambulatory Visit: Payer: Self-pay | Admitting: *Deleted

## 2021-04-28 VITALS — BP 110/80 | HR 125 | Temp 100.3°F | Wt 262.0 lb

## 2021-04-28 DIAGNOSIS — Z20822 Contact with and (suspected) exposure to covid-19: Secondary | ICD-10-CM

## 2021-04-28 DIAGNOSIS — R110 Nausea: Secondary | ICD-10-CM

## 2021-04-28 DIAGNOSIS — U071 COVID-19: Secondary | ICD-10-CM

## 2021-04-28 LAB — POC COVID19 BINAXNOW: SARS Coronavirus 2 Ag: POSITIVE — AB

## 2021-04-28 MED ORDER — ONDANSETRON HCL 4 MG PO TABS: 4.0000 mg | ORAL_TABLET | Freq: Three times a day (TID) | ORAL | 0 refills | Status: DC | PRN

## 2021-04-28 MED ORDER — MOLNUPIRAVIR EUA 200MG CAPSULE: 4.0000 | ORAL_CAPSULE | Freq: Two times a day (BID) | ORAL | 0 refills | Status: AC

## 2021-04-28 NOTE — Telephone Encounter (Signed)
°  Chief Complaint: body aches-daughter tested positive on 04/23/21. Patient tested negative Tuesday. Symptoms: achy lower body, diarrhea, blood sugars up today. Frequency: Began Monday Pertinent Negatives: Patient denies fever/SOB Disposition: [] ED /[] Urgent Care (no appt availability in office) / [] Appointment(In office/virtual)/ [x]  [facility]/ [] Home Care/ [] Refused Recommended Disposition  Additional Notes: Requesting appointment for possible Covid.    Reason for Disposition  [1] MODERATE diarrhea (e.g., 4-6 times / day more than normal) AND [2] present > 48 hours (2 days)  Answer Assessment - Initial Assessment Questions 1. DIARRHEA SEVERITY: "How bad is the diarrhea?" "How many more stools have you had in the past 24 hours than normal?"    - NO DIARRHEA (SCALE 0)   - MILD (SCALE 1-3): Few loose or mushy BMs; increase of 1-3 stools over normal daily number of stools; mild increase in ostomy output.   -  MODERATE (SCALE 4-7): Increase of 4-6 stools daily over normal; moderate increase in ostomy output. * SEVERE (SCALE 8-10; OR 'WORST POSSIBLE'): Increase of 7 or more stools daily over normal; moderate increase in ostomy output; incontinence.     5 watery stools 2. ONSET: "When did the diarrhea begin?"      2 days ago 3. BM CONSISTENCY: "How loose or watery is the diarrhea?"      watery 4. VOMITING: "Are you also vomiting?" If Yes, ask: "How many times in the past 24 hours?"      none 5. ABDOMINAL PAIN: "Are you having any abdominal pain?" If Yes, ask: "What does it feel like?" (e.g., crampy, dull, intermittent, constant)      no 6. ABDOMINAL PAIN SEVERITY: If present, ask: "How bad is the pain?"  (e.g., Scale 1-10; mild, moderate, or severe)   - MILD (1-3): doesn't interfere with normal activities, abdomen soft and not tender to touch    - MODERATE (4-7): interferes with normal activities or awakens from sleep, abdomen tender to touch    - SEVERE (8-10): excruciating  pain, doubled over, unable to do any normal activities       cramping 7. ORAL INTAKE: If vomiting, "Have you been able to drink liquids?" "How much liquids have you had in the past 24 hours?"     Several glasses 8. HYDRATION: "Any signs of dehydration?" (e.g., dry mouth [not just dry lips], too weak to stand, dizziness, new weight loss) "When did you last urinate?"      9. EXPOSURE: "Have you traveled to a foreign country recently?" "Have you been exposed to anyone with diarrhea?" "Could you have eaten any food that was spoiled?" no 10. ANTIBIOTIC USE: "Are you taking antibiotics now or have you taken antibiotics in the past 2 months?"       no 11. OTHER SYMPTOMS: "Do you have any other symptoms?" (e.g., fever, blood in stool)       Body aches, HA 12. PREGNANCY: "Is there any chance you are pregnant?" "When was your last menstrual period?"       na  Protocols used: [facility]

## 2021-04-28 NOTE — Progress Notes (Signed)
Date:  04/28/2021   Name:  Darius Morrison   DOB:  1976/03/24   MRN:  [id number]   Chief Complaint: Cough  Cough This is a new (exposure to covid) problem. The current episode started yesterday. The problem occurs every few minutes. The cough is Non-productive. Associated symptoms include chills, a fever, myalgias and postnasal drip. Pertinent negatives include no sore throat, shortness of breath or wheezing.  BS have been very high all day - up to 350 despite taking Novolog insulin 4 times per day.  He has also had diarrhea and decreased appetite.  He has been drinking water fairly well but only ate 2 eggs this am. Daughter has covid.  He did a home test 2 days ago which was negative.  Lab Results  Component Value Date   NA 137 01/20/2021   K 4.9 01/20/2021   CO2 24 01/20/2021   GLUCOSE 203 (H) 01/20/2021   BUN 14 01/20/2021   CREATININE 1.07 01/20/2021   CALCIUM 9.7 01/20/2021   EGFR 86 01/20/2021   GFRNONAA 81 04/13/2020   Lab Results  Component Value Date   CHOL 125 01/20/2021   HDL 62 01/20/2021   LDLCALC 54 01/20/2021   TRIG 35 01/20/2021   CHOLHDL 2.0 01/20/2021   Lab Results  Component Value Date   TSH 0.936 08/15/2019   Lab Results  Component Value Date   HGBA1C 7.2 (H) 01/20/2021   Lab Results  Component Value Date   WBC 6.6 01/20/2021   HGB 16.2 01/20/2021   HCT 46.1 01/20/2021   MCV 90 01/20/2021   PLT 170 01/20/2021   Lab Results  Component Value Date   ALT 26 01/20/2021   AST 23 01/20/2021   ALKPHOS 75 01/20/2021   BILITOT 0.5 01/20/2021   No results found for: 25OHVITD2, 25OHVITD3, VD25OH   Review of Systems  Constitutional:  Positive for chills and fever.  HENT:  Positive for congestion and postnasal drip. Negative for sore throat and trouble swallowing.   Respiratory:  Positive for cough. Negative for chest tightness, shortness of breath and wheezing.   Gastrointestinal:  Positive for diarrhea and nausea. Negative for abdominal pain.   Musculoskeletal:  Positive for myalgias.  Psychiatric/Behavioral:  Positive for sleep disturbance.    Patient Active Problem List   Diagnosis Date Noted   Type 1 diabetes mellitus with other specified complication ([place]) 93/73/4287   Carpal tunnel syndrome on both sides 04/10/2019   Hyperlipidemia LDL goal <100 09/26/2017   Thyroid nodule 2017   ED (erectile dysfunction) 03/13/2013   OSA on CPAP 11/22/2011    No Known Allergies  Past Surgical History:  Procedure Laterality Date   CYST REMOVAL TRUNK  2000    Social History   Tobacco Use   Smoking status: Never   Smokeless tobacco: Never  Substance Use Topics   Alcohol use: Yes    Comment: rarely   Drug use: No     Medication list has been reviewed and updated.  Current Meds  Medication Sig   aspirin EC 81 MG tablet Take 81 mg by mouth daily.   atorvastatin (LIPITOR) 10 MG tablet TAKE (1) TABLET BY MOUTH EVERY DAY   Continuous Blood Gluc Sensor (FREESTYLE LIBRE 2 SENSOR) MISC Place 1 each onto the skin every 14 (fourteen) days.   insulin glargine (LANTUS) 100 UNIT/ML Solostar Pen Inject 36 Units into the skin at bedtime. (Patient taking differently: Inject 50 Units into the skin at bedtime.)   insulin  lispro (HUMALOG KWIKPEN) 100 UNIT/ML KwikPen Inject 34 Units into the skin 3 (three) times daily.   lisinopril (ZESTRIL) 5 MG tablet TAKE (1) TABLET BY MOUTH EVERY DAY   NON FORMULARY CPAP nightly @ 7 cm H2O   sildenafil (REVATIO) 20 MG tablet Take 1 tablet (20 mg total) by mouth daily as needed.    PHQ 2/9 Scores 04/28/2021 01/20/2021 09/02/2020 04/13/2020  PHQ - 2 Score 0 0 1 0  PHQ- 9 Score _0 0    GAD 7 : Generalized Anxiety Score 04/28/2021 01/20/2021 09/02/2020 04/13/2020  Nervous, Anxious, on Edge 0 0 0 0  Control/stop worrying 0 0 0 0  Worry too much - different things 0 0 0 0  Trouble relaxing 0 0 0 0  Restless 0 0 0 0  Easily annoyed or irritable 0 0 0 0  Afraid - awful might happen 0 0 0 0  Total GAD 7  Score 0 0 0 0  Anxiety Difficulty Not difficult at all Not difficult at all Not difficult at all -    BP Readings from Last 3 Encounters:  04/28/21 110/80  01/20/21 104/68  09/02/20 118/82    Physical Exam Vitals and nursing note reviewed.  Constitutional:      General: He is not in acute distress.    Appearance: He is well-developed. He is ill-appearing.  HENT:     Head: Normocephalic and atraumatic.  Cardiovascular:     Rate and Rhythm: Tachycardia present.     Heart sounds: No murmur heard. Pulmonary:     Effort: Pulmonary effort is normal. No respiratory distress.     Breath sounds: No wheezing or rhonchi.  Abdominal:     Palpations: Abdomen is soft.     Tenderness: There is no abdominal tenderness.  Musculoskeletal:     Cervical back: Normal range of motion.     Right lower leg: No edema.     Left lower leg: No edema.  Lymphadenopathy:     Cervical: No cervical adenopathy.  Skin:    General: Skin is warm and dry.     Findings: No rash.  Neurological:     General: No focal deficit present.     Mental Status: He is alert and oriented to person, place, and time.  Psychiatric:        Mood and Affect: Mood normal.        Behavior: Behavior normal.    Wt Readings from Last 3 Encounters:  04/28/21 262 lb (118.8 kg)  01/20/21 262 lb (118.8 kg)  09/02/20 271 lb (122.9 kg)    BP 110/80    Pulse (!) 125    Temp 100.3 F (37.9 C) (Oral)    Wt 262 lb (118.8 kg)    SpO2 97%    BMI 34.57 kg/m   Assessment and Plan: 1. COVID-19 virus infection Take Tylenol 650 - 1000 mg every 6-8 hours for fever, body aches and headache. Drink plenty of fluids with electrolytes.  Monitor BS regularly, use keto sticks and go to ED if BS can not be controlled. Monitor for fever that does not decrease and/or shortness of breath that worsens or is present at rest. - molnupiravir EUA (LAGEVRIO) 200 mg CAPS capsule; Take 4 capsules (800 mg total) by mouth 2 (two) times daily for 5 days.   Dispense: 40 capsule; Refill: 0  2. Close exposure to COVID-19 virus - POC COVID-19 BinaxNow  3. Nausea Use zofran tid as needed [place] diet; no  treatment for diarrhea - ondansetron (ZOFRAN) 4 MG tablet; Take 1 tablet (4 mg total) by mouth every 8 (eight) hours as needed for nausea or vomiting.  Dispense: 30 tablet; Refill: 0   Partially dictated using Editor, commissioning. Any errors are unintentional.  Halina Maidens, MD [facility]  04/28/2021

## 2021-04-29 ENCOUNTER — Ambulatory Visit: Payer: No Typology Code available for payment source | Admitting: Internal Medicine

## 2021-05-05 ENCOUNTER — Encounter: Payer: Self-pay | Admitting: Internal Medicine

## 2021-05-05 ENCOUNTER — Other Ambulatory Visit: Payer: Self-pay | Admitting: Internal Medicine

## 2021-05-05 DIAGNOSIS — E1065 Type 1 diabetes mellitus with hyperglycemia: Secondary | ICD-10-CM

## 2021-05-05 MED ORDER — BASAGLAR KWIKPEN 100 UNIT/ML ~~LOC~~ SOPN: 50.0000 [IU] | PEN_INJECTOR | Freq: Every day | SUBCUTANEOUS | 1 refills | Status: DC

## 2021-05-05 MED ORDER — DEXCOM G6 RECEIVER DEVI: 1.0000 | 0 refills | Status: DC

## 2021-05-05 MED ORDER — DEXCOM G6 SENSOR MISC: 1.0000 | 1 refills | Status: DC

## 2021-05-10 ENCOUNTER — Other Ambulatory Visit: Payer: Self-pay

## 2021-05-10 DIAGNOSIS — E1065 Type 1 diabetes mellitus with hyperglycemia: Secondary | ICD-10-CM

## 2021-05-10 MED ORDER — DEXCOM G6 TRANSMITTER MISC: 1.0000 | Freq: Every day | 0 refills | Status: DC

## 2021-05-10 MED ORDER — INSULIN ASPART FLEXPEN 100 UNIT/ML ~~LOC~~ SOPN: PEN_INJECTOR | SUBCUTANEOUS | 5 refills | Status: DC

## 2021-05-24 ENCOUNTER — Ambulatory Visit (INDEPENDENT_AMBULATORY_CARE_PROVIDER_SITE_OTHER): Payer: BC Managed Care – PPO | Admitting: Internal Medicine

## 2021-05-24 ENCOUNTER — Other Ambulatory Visit: Payer: Self-pay

## 2021-05-24 ENCOUNTER — Encounter: Payer: Self-pay | Admitting: Internal Medicine

## 2021-05-24 VITALS — BP 108/66 | HR 83 | Ht 73.0 in | Wt 266.0 lb

## 2021-05-24 DIAGNOSIS — E1069 Type 1 diabetes mellitus with other specified complication: Secondary | ICD-10-CM

## 2021-05-24 LAB — POCT GLYCOSYLATED HEMOGLOBIN (HGB A1C): Hemoglobin A1C: 6.8 % — AB (ref 4.0–5.6)

## 2021-05-24 NOTE — Progress Notes (Signed)
Date:  05/24/2021   Name:  Darius Morrison   DOB:  23-Dec-1975   MRN:  [id number]   Chief Complaint: Diabetes  Diabetes He presents for his follow-up diabetic visit. He has type 2 diabetes mellitus. His disease course has been stable. Pertinent negatives for hypoglycemia include no headaches or tremors. Pertinent negatives for diabetes include no chest pain, no fatigue, no polydipsia and no polyuria. (He has slow stomach emptying after a meal) There are no hypoglycemic complications. Current diabetic treatment includes insulin injections. He is compliant with treatment all of the time. His weight is stable. There is no change in his home blood glucose trend. An ACE inhibitor/angiotensin II receptor blocker is being taken. Eye exam is current.  He is doing much better since Covid, he was able to take the antiviral and hydrate well.   He is now using Dexcom which gives continuous readings. GMI estimate after only a month of use is 7.1%. Lab Results  Component Value Date   NA 137 01/20/2021   K 4.9 01/20/2021   CO2 24 01/20/2021   GLUCOSE 203 (H) 01/20/2021   BUN 14 01/20/2021   CREATININE 1.07 01/20/2021   CALCIUM 9.7 01/20/2021   EGFR 86 01/20/2021   GFRNONAA 81 04/13/2020   Lab Results  Component Value Date   CHOL 125 01/20/2021   HDL 62 01/20/2021   LDLCALC 54 01/20/2021   TRIG 35 01/20/2021   CHOLHDL 2.0 01/20/2021   Lab Results  Component Value Date   TSH 0.936 08/15/2019   Lab Results  Component Value Date   HGBA1C 7.2 (H) 01/20/2021   Lab Results  Component Value Date   WBC 6.6 01/20/2021   HGB 16.2 01/20/2021   HCT 46.1 01/20/2021   MCV 90 01/20/2021   PLT 170 01/20/2021   Lab Results  Component Value Date   ALT 26 01/20/2021   AST 23 01/20/2021   ALKPHOS 75 01/20/2021   BILITOT 0.5 01/20/2021   No results found for: 25OHVITD2, 25OHVITD3, VD25OH   Review of Systems  Constitutional:  Negative for appetite change, fatigue and unexpected weight change.   Eyes:  Negative for visual disturbance.  Respiratory:  Negative for cough, shortness of breath and wheezing.   Cardiovascular:  Negative for chest pain, palpitations and leg swelling.  Gastrointestinal:  Negative for abdominal pain and blood in stool.  Endocrine: Negative for polydipsia and polyuria.  Genitourinary:  Negative for dysuria and hematuria.  Skin:  Negative for color change and rash.  Neurological:  Negative for tremors, numbness and headaches.  Psychiatric/Behavioral:  Negative for dysphoric mood.    Patient Active Problem List   Diagnosis Date Noted   Type 1 diabetes mellitus with other specified complication ([place]) 69/45/0388   Carpal tunnel syndrome on both sides 04/10/2019   Hyperlipidemia LDL goal <100 09/26/2017   Thyroid nodule 2017   ED (erectile dysfunction) 03/13/2013   OSA on CPAP 11/22/2011    No Known Allergies  Past Surgical History:  Procedure Laterality Date   CYST REMOVAL TRUNK  2000    Social History   Tobacco Use   Smoking status: Never   Smokeless tobacco: Never  Substance Use Topics   Alcohol use: Yes    Comment: rarely   Drug use: No     Medication list has been reviewed and updated.  Current Meds  Medication Sig   aspirin EC 81 MG tablet Take 81 mg by mouth daily.   atorvastatin (LIPITOR) 10 MG tablet  TAKE (1) TABLET BY MOUTH EVERY DAY   Continuous Blood Gluc Sensor (DEXCOM G6 SENSOR) MISC Place 1 each onto the skin continuous.   Continuous Blood Gluc Transmit (DEXCOM G6 TRANSMITTER) MISC 1 each by Does not apply route daily.   Insulin Aspart FlexPen (NOVOLOG) 100 UNIT/ML ADMINISTER 34 UNITS UNDER THE SKIN THREE TIMES DAILY WITH MEALS   Insulin Glargine (BASAGLAR KWIKPEN) 100 UNIT/ML Inject 50 Units into the skin at bedtime.   lisinopril (ZESTRIL) 5 MG tablet TAKE (1) TABLET BY MOUTH EVERY DAY   NON FORMULARY CPAP nightly @ 7 cm H2O   sildenafil (REVATIO) 20 MG tablet Take 1 tablet (20 mg total) by mouth daily as needed.     PHQ 2/9 Scores 05/24/2021 04/28/2021 01/20/2021 09/02/2020  PHQ - 2 Score 0 0 0 1  PHQ- 9 Score 0 3 1 3     GAD 7 : Generalized Anxiety Score 05/24/2021 04/28/2021 01/20/2021 09/02/2020  Nervous, Anxious, on Edge 0 0 0 0  Control/stop worrying 0 0 0 0  Worry too much - different things 0 0 0 0  Trouble relaxing 0 0 0 0  Restless 0 0 0 0  Easily annoyed or irritable 0 0 0 0  Afraid - awful might happen 0 0 0 0  Total GAD 7 Score 0 0 0 0  Anxiety Difficulty Not difficult at all Not difficult at all Not difficult at all Not difficult at all    BP Readings from Last 3 Encounters:  05/24/21 108/66  04/28/21 110/80  01/20/21 104/68    Physical Exam Vitals and nursing note reviewed.  Constitutional:      General: He is not in acute distress.    Appearance: He is well-developed.  HENT:     Head: Normocephalic and atraumatic.  Cardiovascular:     Rate and Rhythm: Normal rate and regular rhythm.     Heart sounds: No murmur heard. Pulmonary:     Effort: Pulmonary effort is normal. No respiratory distress.     Breath sounds: No wheezing or rhonchi.  Musculoskeletal:     Cervical back: Normal range of motion.     Right lower leg: No edema.     Left lower leg: No edema.  Lymphadenopathy:     Cervical: No cervical adenopathy.  Skin:    General: Skin is warm and dry.     Findings: No rash.  Neurological:     Mental Status: He is alert and oriented to person, place, and time.  Psychiatric:        Mood and Affect: Mood normal.        Behavior: Behavior normal.    Wt Readings from Last 3 Encounters:  05/24/21 266 lb (120.7 kg)  04/28/21 262 lb (118.8 kg)  01/20/21 262 lb (118.8 kg)    BP 108/66    Pulse 83    Ht 6' 1"  (1.854 m)    Wt 266 lb (120.7 kg)    SpO2 98%    BMI 35.09 kg/m   Assessment and Plan: 1. Type 1 diabetes mellitus with other specified complication ([place]) BS is well controlled on basal-bolus. He has some delayed gastric emptying symptoms - recommend dosing  bolus insulin after meals for better coverage. Eye exam will be requested - POCT glycosylated hemoglobin (Hb A1C) = 6.8 - Microalbumin / creatinine urine ratio   Partially dictated using Editor, commissioning. Any errors are unintentional.  Halina Maidens, MD [facility]  05/24/2021

## 2021-05-25 LAB — MICROALBUMIN / CREATININE URINE RATIO
Creatinine, Urine: 31.1 mg/dL
Microalb/Creat Ratio: <10 mg/g creat (ref 0–29)
Microalbumin, Urine: <3 ug/mL

## 2021-05-25 LAB — HM DIABETES EYE EXAM

## 2021-05-26 ENCOUNTER — Encounter: Payer: Self-pay | Admitting: Internal Medicine

## 2021-06-26 ENCOUNTER — Other Ambulatory Visit: Payer: Self-pay | Admitting: Internal Medicine

## 2021-06-26 DIAGNOSIS — E1069 Type 1 diabetes mellitus with other specified complication: Secondary | ICD-10-CM

## 2021-06-26 DIAGNOSIS — E785 Hyperlipidemia, unspecified: Secondary | ICD-10-CM

## 2021-06-28 NOTE — Telephone Encounter (Signed)
Requested Prescriptions  Pending Prescriptions Disp Refills   atorvastatin (LIPITOR) 10 MG tablet [Pharmacy Med Name: ATORVASTATIN CALCIUM 10 MG TAB] 90 tablet 1    Sig: TAKE (1) TABLET BY MOUTH EVERY DAY     Cardiovascular:  Antilipid - Statins Failed - 06/26/2021  9:40 AM      Failed - Lipid Panel in normal range within the last 12 months    Cholesterol, Total  Date Value Ref Range Status  01/20/2021 125 100 - 199 mg/dL Final   LDL Chol Calc (NIH)  Date Value Ref Range Status  01/20/2021 54 0 - 99 mg/dL Final   HDL  Date Value Ref Range Status  01/20/2021 62 >39 mg/dL Final   Triglycerides  Date Value Ref Range Status  01/20/2021 35 0 - 149 mg/dL Final         Passed - Patient is not pregnant      Passed - Valid encounter within last 12 months    Recent Outpatient Visits          1 month ago Type 1 diabetes mellitus with other specified complication [facility])   [facility] Reubin Milan, MD   2 months ago COVID-19 virus infection   [facility] Reubin Milan, MD   5 months ago Annual physical exam   [facility] Reubin Milan, MD   9 months ago Type 1 diabetes mellitus with other specified complication [facility])   [facility] Reubin Milan, MD   1 year ago Type 1 diabetes mellitus with hyperglycemia [facility])   [facility] Reubin Milan, MD      Future Appointments            In 2 months Reubin Milan, MD [facility]            lisinopril (ZESTRIL) 5 MG tablet [Pharmacy Med Name: LISINOPRIL 5 MG TAB] 90 tablet 0    Sig: TAKE (1) TABLET BY MOUTH EVERY DAY     Cardiovascular:  ACE Inhibitors Passed - 06/26/2021  9:40 AM      Passed - Cr in normal range and within 180 days    Creatinine, Ser  Date Value Ref Range Status  01/20/2021 1.07 0.76 - 1.27 mg/dL Final         Passed - K in normal range and within 180 days    Potassium  Date Value Ref Range Status  01/20/2021 4.9 3.5 -  5.2 mmol/L Final         Passed - Patient is not pregnant      Passed - Last BP in normal range    BP Readings from Last 1 Encounters:  05/24/21 108/66         Passed - Valid encounter within last 6 months    Recent Outpatient Visits          1 month ago Type 1 diabetes mellitus with other specified complication [facility])   [facility] Reubin Milan, MD   2 months ago COVID-19 virus infection   [facility] Reubin Milan, MD   5 months ago Annual physical exam   [facility] Reubin Milan, MD   9 months ago Type 1 diabetes mellitus with other specified complication [facility])   [facility] Reubin Milan, MD   1 year ago Type 1 diabetes mellitus with hyperglycemia [facility])   [facility] Reubin Milan, MD  Future Appointments            In 2 months Judithann Graves, Nyoka Cowden, MD [facility]

## 2021-07-17 ENCOUNTER — Other Ambulatory Visit: Payer: Self-pay

## 2021-07-17 ENCOUNTER — Ambulatory Visit
Admission: EM | Admit: 2021-07-17 | Discharge: 2021-07-17 | Disposition: A | Discharge status: Home / Self Care | Payer: BC Managed Care – PPO | Attending: Internal Medicine | Admitting: Internal Medicine

## 2021-07-17 DIAGNOSIS — J40 Bronchitis, not specified as acute or chronic: Secondary | ICD-10-CM

## 2021-07-17 MED ORDER — AZITHROMYCIN 250 MG PO TABS: ORAL_TABLET | ORAL | 0 refills | Status: DC

## 2021-07-17 MED ORDER — ALBUTEROL SULFATE HFA 108 (90 BASE) MCG/ACT IN AERS: 2.0000 | INHALATION_SPRAY | RESPIRATORY_TRACT | 0 refills | Status: DC | PRN

## 2021-07-17 MED ORDER — PREDNISONE 20 MG PO TABS: 20.0000 mg | ORAL_TABLET | Freq: Every day | ORAL | 0 refills | Status: DC

## 2021-07-17 MED ORDER — HYDROCOD POLI-CHLORPHE POLI ER 10-8 MG/5ML PO SUER: 5.0000 mL | Freq: Every evening | ORAL | 0 refills | Status: DC | PRN

## 2021-07-17 NOTE — ED Triage Notes (Signed)
Patient is here for "congestion, cough for about 2 wks now". Felt some better on Wed, then Thursday seem's to have moved down chest causing a lot of congestion in chest". No fever. No sob. No new/unexplained rash. @ home COVID19 test "negative" (done about 2 days after symptoms had began).  ?

## 2021-07-17 NOTE — ED Provider Notes (Signed)
?[facility] ? ? ? ?CSN: [id number] ?Arrival date & time: 07/17/21  1435 ? ? ?  ? ?History   ?Chief Complaint ?Chief Complaint  ?Patient presents with  ? Cough  ? Nasal Congestion  ? ? ?HPI ?Darius Morrison is a 46 y.o. male who has been having sneezing, and cough with rhinitis x 1 week. He felt better 3 days ago, but 2 days ago got worse, and now been having cough attacks. He was outside cutting grass and seemed to get worse cough attacks. Has wheezing a few years back around this time and had to be placed on an inhaler. Has noticed himself wheezing specially at night time. ?Was taking Delsym, but caused his glucose to go to 300's. Not is back to his normal around 150's.  ?His cough has been productive with yellow mucous. Today he took 2 Zyrtecs and feels better right now.  ? ? ? ?Past Medical History:  ?Diagnosis Date  ? Diabetes type 1, controlled (HCC)   ? ? ?Patient Active Problem List  ? Diagnosis Date Noted  ? Type 1 diabetes mellitus with other specified complication (HCC) 09/02/2020  ? Carpal tunnel syndrome on both sides 04/10/2019  ? Hyperlipidemia LDL goal <100 09/26/2017  ? Thyroid nodule 2017  ? ED (erectile dysfunction) 03/13/2013  ? OSA on CPAP 11/22/2011  ? ? ?Past Surgical History:  ?Procedure Laterality Date  ? CYST REMOVAL TRUNK  2000  ? ? ? ? ? ?Home Medications   ? ?Prior to Admission medications   ?Medication Sig Start Date End Date Taking? Authorizing Provider  ?albuterol (VENTOLIN HFA) 108 (90 Base) MCG/ACT inhaler Inhale 2 puffs into the lungs every 4 (four) hours as needed for wheezing or shortness of breath. 07/17/21  Yes Rodriguez-Southworth, Nettie Elm, PA-C  ?aspirin EC 81 MG tablet Take 81 mg by mouth daily.   Yes [provider]  ?atorvastatin (LIPITOR) 10 MG tablet TAKE (1) TABLET BY MOUTH EVERY DAY 06/28/21  Yes Reubin Milan, MD  ?azithromycin (ZITHROMAX Z-PAK) 250 MG tablet 2 today, then one qd x 4 07/17/21  Yes Rodriguez-Southworth, Nettie Elm, PA-C   ?chlorpheniramine-HYDROcodone (TUSSIONEX PENNKINETIC ER) 10-8 MG/5ML Take 5 mLs by mouth at bedtime as needed for cough. 07/17/21  Yes Rodriguez-Southworth, Nettie Elm, PA-C  ?Insulin Aspart FlexPen (NOVOLOG) 100 UNIT/ML ADMINISTER 34 UNITS UNDER THE SKIN THREE TIMES DAILY WITH MEALS 05/10/21  Yes Reubin Milan, MD  ?Insulin Glargine [facility] KWIKPEN) 100 UNIT/ML Inject 50 Units into the skin at bedtime. 05/05/21  Yes Reubin Milan, MD  ?lisinopril (ZESTRIL) 5 MG tablet TAKE (1) TABLET BY MOUTH EVERY DAY 06/28/21  Yes Reubin Milan, MD  ?predniSONE (DELTASONE) 20 MG tablet Take 1 tablet (20 mg total) by mouth daily with breakfast. 07/17/21  Yes Rodriguez-Southworth, Nettie Elm, PA-C  ?sildenafil (REVATIO) 20 MG tablet Take 1 tablet (20 mg total) by mouth daily as needed. 09/02/20  Yes Reubin Milan, MD  ?Continuous Blood Gluc Sensor (DEXCOM G6 SENSOR) MISC Place 1 each onto the skin continuous. 05/05/21   Reubin Milan, MD  ?Continuous Blood Gluc Transmit (DEXCOM G6 TRANSMITTER) MISC 1 each by Does not apply route daily. 05/10/21   Reubin Milan, MD  ?NON FORMULARY CPAP nightly @ 7 cm H2O    [provider]  ? ? ?Family History ?Family History  ?Problem Relation Age of Onset  ? Thyroid disease Mother   ? Colon cancer Paternal Uncle   ? Diabetes Maternal Grandmother   ? Heart  attack Maternal Grandfather   ? Heart disease Maternal Grandfather   ? Dementia Paternal Grandmother   ? Dementia Paternal Grandfather   ? Heart attack Paternal Grandfather   ? Heart disease Paternal Grandfather   ? ? ?Social History ?Social History  ? ?Tobacco Use  ? Smoking status: Never  ? Smokeless tobacco: Never  ?Vaping Use  ? Vaping Use: Never used  ?Substance Use Topics  ? Alcohol use: Yes  ?  Comment: rarely  ? Drug use: No  ? ? ? ?Allergies   ?Patient has no known allergies. ? ? ?Review of Systems ?Review of Systems  ?Constitutional:  Negative for appetite change, chills, diaphoresis and fever.  ?HENT:  Positive for  congestion, postnasal drip, rhinorrhea and sneezing. Negative for ear discharge, ear pain and sore throat.   ?Eyes:  Negative for discharge.  ?Respiratory:  Positive for cough and wheezing.   ?Musculoskeletal:  Negative for myalgias.  ?Allergic/Immunologic: Positive for environmental allergies.  ?Neurological:  Negative for headaches.  ? ? ?Physical Exam ?Triage Vital Signs ?ED Triage Vitals  ?Enc Vitals Group  ?   BP 07/17/21 1456 107/77  ?   Pulse Rate 07/17/21 1456 98  ?   Resp 07/17/21 1454 18  ?   Temp 07/17/21 1456 98.2 ?F (36.8 ?C)  ?   Temp Source 07/17/21 1454 Oral  ?   SpO2 07/17/21 1456 97 %  ?   Weight 07/17/21 1452 260 lb (117.9 kg)  ?   Height 07/17/21 1452 6\' 1"  (1.854 m)  ?   Head Circumference --   ?   Peak Flow --   ?   Pain Score 07/17/21 1452 0  ?   Pain Loc --   ?   Pain Edu? --   ?   Excl. in GC? --   ? ?No data found. ? ?Updated Vital Signs ?BP 107/77 (BP Location: Right Arm)   Pulse 98   Temp 98.2 ?F (36.8 ?C) (Oral)   Resp 18   Ht 6\' 1"  (1.854 m)   Wt 260 lb (117.9 kg)   SpO2 97%   BMI 34.30 kg/m?  ? ?Visual Acuity ?Right Eye Distance:   ?Left Eye Distance:   ?Bilateral Distance:   ? ?Right Eye Near:   ?Left Eye Near:    ?Bilateral Near:    ? ?Physical Exam ?Alert pt NAD who seems nasally congested ?EYES- non icterus, mild watering, no purulent drainage ?NOSE- moderate mucosa congestion which is pale pink with clear mucous. No sinus tenderness ?TM- both gray and little dull, canals are normal ?PHARYNX- clear, clear drainage noted.  ?NECK- supple with no nodes ?LUNGS- has faint end of expiration wheezing.  ?HEART - RRR with no murmurs ?SKIN- non jaundiced, no rashes.  ? ? ?UC Treatments / Results  ?Labs ?(all labs ordered are listed, but only abnormal results are displayed) ?Labs Reviewed - No data to display ? ?EKG ? ? ?Radiology ?No results found. ? ?Procedures ?Procedures (including critical care time) ? ?Medications Ordered in UC ?Medications - No data to display ? ?Initial  Impression / Assessment and Plan / UC Course  ?I have reviewed the triage vital signs and the nursing notes. ?Allergic rhinitis ?Bronchitis ?I placed him on Albuterol inhaler, prednisone, Zpack, Tussionex as noted, also advised him  to stay on the Zyrtec .  ?Final Clinical Impressions(s) / UC Diagnoses  ? ?Final diagnoses:  ?Bronchitis  ? ?Discharge Instructions   ?None ?  ? ?ED Prescriptions   ? ?  Medication Sig Dispense Auth. Provider  ? albuterol (VENTOLIN HFA) 108 (90 Base) MCG/ACT inhaler Inhale 2 puffs into the lungs every 4 (four) hours as needed for wheezing or shortness of breath. 18 g Rodriguez-Southworth, Nettie ElmSylvia, PA-C  ? azithromycin (ZITHROMAX Z-PAK) 250 MG tablet 2 today, then one qd x 4 6 tablet Rodriguez-Southworth, Nettie ElmSylvia, PA-C  ? predniSONE (DELTASONE) 20 MG tablet Take 1 tablet (20 mg total) by mouth daily with breakfast. 3 tablet Rodriguez-Southworth, Nettie ElmSylvia, PA-C  ? chlorpheniramine-HYDROcodone (TUSSIONEX PENNKINETIC ER) 10-8 MG/5ML Take 5 mLs by mouth at bedtime as needed for cough. 115 mL Rodriguez-Southworth, Nettie ElmSylvia, PA-C  ? ?  ? ?I have reviewed the PDMP during this encounter. ?  ?Garey HamRodriguez-Southworth, Blessed Girdner, PA-C ?07/17/21 1620 ? ?

## 2021-07-21 ENCOUNTER — Ambulatory Visit: Payer: BC Managed Care – PPO | Admitting: Internal Medicine

## 2021-07-28 ENCOUNTER — Other Ambulatory Visit: Payer: Self-pay | Admitting: Internal Medicine

## 2021-07-28 DIAGNOSIS — N529 Male erectile dysfunction, unspecified: Secondary | ICD-10-CM

## 2021-07-29 ENCOUNTER — Other Ambulatory Visit: Payer: Self-pay | Admitting: Internal Medicine

## 2021-07-29 MED ORDER — DEXCOM G6 TRANSMITTER MISC: 1.0000 | Freq: Every day | 1 refills | Status: DC

## 2021-07-29 NOTE — Telephone Encounter (Signed)
Requested Prescriptions  ?Pending Prescriptions Disp Refills  ?? sildenafil (REVATIO) 20 MG tablet [Pharmacy Med Name: SILDENAFIL CITRATE 20 MG TAB] 30 tablet 1  ?  Sig: TAKE (1) TABLET BY MOUTH EVERY DAY AS NEEDED  ?  ? Urology: Erectile Dysfunction Agents Passed - 07/28/2021 10:08 AM  ?  ?  Passed - AST in normal range and within 360 days  ?  AST  ?Date Value Ref Range Status  ?01/20/2021 23 0 - 40 IU/L Final  ?   ?  ?  Passed - ALT in normal range and within 360 days  ?  ALT  ?Date Value Ref Range Status  ?01/20/2021 26 0 - 44 IU/L Final  ?   ?  ?  Passed - Last BP in normal range  ?  BP Readings from Last 1 Encounters:  ?07/17/21 107/77  ?   ?  ?  Passed - Valid encounter within last 12 months  ?  Recent Outpatient Visits   ?      ? 2 months ago Type 1 diabetes mellitus with other specified complication (HCC)  ? [facility] Reubin Milan, MD  ? 3 months ago COVID-19 virus infection  ? [facility] Reubin Milan, MD  ? 6 months ago Annual physical exam  ? [facility] Reubin Milan, MD  ? 11 months ago Type 1 diabetes mellitus with other specified complication [facility])  ? [facility] Reubin Milan, MD  ? 1 year ago Type 1 diabetes mellitus with hyperglycemia (HCC)  ? [facility] Reubin Milan, MD  ?  ?  ?Future Appointments   ?        ? In 1 month Judithann Graves Nyoka Cowden, MD [facility]  ?  ? ?  ?  ?  ? ? ?

## 2021-08-09 ENCOUNTER — Encounter: Payer: Self-pay | Admitting: Internal Medicine

## 2021-08-09 ENCOUNTER — Other Ambulatory Visit: Payer: Self-pay | Admitting: Internal Medicine

## 2021-08-09 DIAGNOSIS — E1065 Type 1 diabetes mellitus with hyperglycemia: Secondary | ICD-10-CM

## 2021-08-09 MED ORDER — DEXCOM G6 SENSOR MISC: 1.0000 | 3 refills | Status: DC

## 2021-09-21 ENCOUNTER — Ambulatory Visit: Payer: BC Managed Care – PPO | Admitting: Internal Medicine

## 2021-09-21 ENCOUNTER — Encounter: Payer: Self-pay | Admitting: Internal Medicine

## 2021-09-21 VITALS — BP 124/82 | HR 95 | Ht 73.0 in | Wt 266.0 lb

## 2021-09-21 DIAGNOSIS — E785 Hyperlipidemia, unspecified: Secondary | ICD-10-CM | POA: Diagnosis not present

## 2021-09-21 DIAGNOSIS — E1069 Type 1 diabetes mellitus with other specified complication: Secondary | ICD-10-CM | POA: Diagnosis not present

## 2021-09-21 LAB — POCT GLYCOSYLATED HEMOGLOBIN (HGB A1C): Hemoglobin A1C: 6.6 % — AB (ref 4.0–5.6)

## 2021-09-21 MED ORDER — LISINOPRIL 5 MG PO TABS: ORAL_TABLET | ORAL | 3 refills | Status: DC

## 2021-09-21 MED ORDER — ATORVASTATIN CALCIUM 10 MG PO TABS: ORAL_TABLET | ORAL | 1 refills | Status: DC

## 2021-09-21 NOTE — Progress Notes (Signed)
Date:  09/21/2021   Name:  Darius Morrison   DOB:  09/28/1975   MRN:  [id number]   Chief Complaint: Diabetes  Diabetes He presents for his follow-up diabetic visit. He has type 1 diabetes mellitus. His disease course has been stable. Pertinent negatives for hypoglycemia include no headaches or tremors. Pertinent negatives for diabetes include no blurred vision, no chest pain, no fatigue, no foot paresthesias, no foot ulcerations, no polydipsia, no polyuria, no visual change and no weight loss. Symptoms are stable. Current diabetic treatment includes insulin injections (Basaglar and Novolog).  Novolog down to 14 units with each meal.  Basaglar down to 30 units.  Less lows in the middle of the night and less daytime spikes.  Metamucil and diet changes have been made. Lab Results  Component Value Date   NA 137 01/20/2021   K 4.9 01/20/2021   CO2 24 01/20/2021   GLUCOSE 203 (H) 01/20/2021   BUN 14 01/20/2021   CREATININE 1.07 01/20/2021   CALCIUM 9.7 01/20/2021   EGFR 86 01/20/2021   GFRNONAA 81 04/13/2020   Lab Results  Component Value Date   CHOL 125 01/20/2021   HDL 62 01/20/2021   LDLCALC 54 01/20/2021   TRIG 35 01/20/2021   CHOLHDL 2.0 01/20/2021   Lab Results  Component Value Date   TSH 0.936 08/15/2019   Lab Results  Component Value Date   HGBA1C 6.6 (A) 09/21/2021   Lab Results  Component Value Date   WBC 6.6 01/20/2021   HGB 16.2 01/20/2021   HCT 46.1 01/20/2021   MCV 90 01/20/2021   PLT 170 01/20/2021   Lab Results  Component Value Date   ALT 26 01/20/2021   AST 23 01/20/2021   ALKPHOS 75 01/20/2021   BILITOT 0.5 01/20/2021   No results found for: 25OHVITD2, 25OHVITD3, VD25OH   Review of Systems  Constitutional:  Negative for appetite change, fatigue, unexpected weight change and weight loss.  Eyes:  Negative for blurred vision and visual disturbance.  Respiratory:  Negative for cough, shortness of breath and wheezing.   Cardiovascular:  Negative  for chest pain, palpitations and leg swelling.  Gastrointestinal:  Negative for abdominal pain and blood in stool.  Endocrine: Negative for polydipsia and polyuria.  Genitourinary:  Negative for dysuria and hematuria.  Skin:  Negative for color change and rash.  Neurological:  Negative for tremors, numbness and headaches.  Psychiatric/Behavioral:  Negative for dysphoric mood.    Patient Active Problem List   Diagnosis Date Noted   Type 1 diabetes mellitus with other specified complication ([place]) 84/53/6468   Carpal tunnel syndrome on both sides 04/10/2019   Hyperlipidemia LDL goal <100 09/26/2017   Thyroid nodule 2017   ED (erectile dysfunction) 03/13/2013   OSA on CPAP 11/22/2011    No Known Allergies  Past Surgical History:  Procedure Laterality Date   CYST REMOVAL TRUNK  2000    Social History   Tobacco Use   Smoking status: Never   Smokeless tobacco: Never  Vaping Use   Vaping Use: Never used  Substance Use Topics   Alcohol use: Yes    Comment: rarely   Drug use: No     Medication list has been reviewed and updated.  Current Meds  Medication Sig   albuterol (VENTOLIN HFA) 108 (90 Base) MCG/ACT inhaler Inhale 2 puffs into the lungs every 4 (four) hours as needed for wheezing or shortness of breath.   aspirin EC 81 MG tablet Take 81  mg by mouth daily.   Continuous Blood Gluc Sensor (DEXCOM G6 SENSOR) MISC Place 1 each onto the skin continuous.   Continuous Blood Gluc Transmit (DEXCOM G6 TRANSMITTER) MISC 1 each by Does not apply route daily.   Insulin Aspart FlexPen (NOVOLOG) 100 UNIT/ML ADMINISTER 34 UNITS UNDER THE SKIN THREE TIMES DAILY WITH MEALS   Insulin Glargine (BASAGLAR KWIKPEN) 100 UNIT/ML Inject 50 Units into the skin at bedtime.   NON FORMULARY CPAP nightly @ 7 cm H2O   sildenafil (REVATIO) 20 MG tablet TAKE (1) TABLET BY MOUTH EVERY DAY AS NEEDED   [DISCONTINUED] atorvastatin (LIPITOR) 10 MG tablet TAKE (1) TABLET BY MOUTH EVERY DAY   [DISCONTINUED]  lisinopril (ZESTRIL) 5 MG tablet TAKE (1) TABLET BY MOUTH EVERY DAY       09/21/2021    2:41 PM 05/24/2021   10:16 AM 04/28/2021    3:37 PM 01/20/2021   10:28 AM  GAD 7 : Generalized Anxiety Score  Nervous, Anxious, on Edge 0 0 0 0  Control/stop worrying 0 0 0 0  Worry too much - different things 0 0 0 0  Trouble relaxing 0 0 0 0  Restless 0 0 0 0  Easily annoyed or irritable 0 0 0 0  Afraid - awful might happen 0 0 0 0  Total GAD 7 Score 0 0 0 0  Anxiety Difficulty Not difficult at all Not difficult at all Not difficult at all Not difficult at all       09/21/2021    2:41 PM  Depression screen PHQ 2/9  Decreased Interest 0  Down, Depressed, Hopeless 0  PHQ - 2 Score 0  Altered sleeping 0  Tired, decreased energy 0  Change in appetite 0  Feeling bad or failure about yourself  0  Trouble concentrating 0  Moving slowly or fidgety/restless 0  Suicidal thoughts 0  PHQ-9 Score 0  Difficult doing work/chores Not difficult at all    BP Readings from Last 3 Encounters:  09/21/21 124/82  07/17/21 107/77  05/24/21 108/66    Physical Exam Vitals and nursing note reviewed.  Constitutional:      General: He is not in acute distress.    Appearance: He is well-developed.  HENT:     Head: Normocephalic and atraumatic.  Pulmonary:     Effort: Pulmonary effort is normal. No respiratory distress.  Skin:    General: Skin is warm and dry.     Findings: No rash.  Neurological:     Mental Status: He is alert and oriented to person, place, and time.  Psychiatric:        Mood and Affect: Mood normal.        Behavior: Behavior normal.    Wt Readings from Last 3 Encounters:  09/21/21 266 lb (120.7 kg)  07/17/21 260 lb (117.9 kg)  05/24/21 266 lb (120.7 kg)    BP 124/82   Pulse 95   Ht 6' 1"  (1.854 m)   Wt 266 lb (120.7 kg)   SpO2 98%   BMI 35.09 kg/m   Assessment and Plan: 1. Type 1 diabetes mellitus with other specified complication (HCC) BS is much improved with  diet changes and metamucil to help smooth glucose absorption. His insulin dose is decreased with the aid of the Dexcom. Continue current regimen with Basaglar 30 and Novolog 14 AC. - POCT glycosylated hemoglobin (Hb A1C)= 6.6 down from 6.8 - lisinopril (ZESTRIL) 5 MG tablet; TAKE (1) TABLET BY MOUTH EVERY DAY  Dispense: 90 tablet; Refill: 3  2. Hyperlipidemia LDL goal <100 - atorvastatin (LIPITOR) 10 MG tablet; TAKE (1) TABLET BY MOUTH EVERY DAY  Dispense: 90 tablet; Refill: 1   Partially dictated using Editor, commissioning. Any errors are unintentional.  Halina Maidens, MD [facility]  09/21/2021

## 2021-12-23 ENCOUNTER — Other Ambulatory Visit: Payer: Self-pay | Admitting: Internal Medicine

## 2021-12-23 DIAGNOSIS — E1065 Type 1 diabetes mellitus with hyperglycemia: Secondary | ICD-10-CM

## 2021-12-23 NOTE — Telephone Encounter (Signed)
Requested Prescriptions  Pending Prescriptions Disp Refills  . Insulin Glargine (BASAGLAR KWIKPEN) 100 UNIT/ML [Pharmacy Med Name: BASAGLAR 100 UNIT/ML KWIKPEN] 45 mL 0    Sig: INJECT 50 UNITS INTO THE SKIN AT BEDTIME.     Endocrinology:  Diabetes - Insulins Passed - 12/23/2021  9:44 AM      Passed - HBA1C is between 0 and 7.9 and within 180 days    Hemoglobin A1C  Date Value Ref Range Status  09/21/2021 6.6 (A) 4.0 - 5.6 % Final  10/10/2018 7.2  Final   Hgb A1c MFr Bld  Date Value Ref Range Status  01/20/2021 7.2 (H) 4.8 - 5.6 % Final    Comment:             Prediabetes: 5.7 - 6.4          Diabetes: >6.4          Glycemic control for adults with diabetes: <7.0          Passed - Valid encounter within last 6 months    Recent Outpatient Visits          3 months ago Type 1 diabetes mellitus with other specified complication (HCC)   [place] [facility] at [facility], Nyoka Cowden, MD   7 months ago Type 1 diabetes mellitus with other specified complication [facility])   [place] [facility] at [facility], Nyoka Cowden, MD   7 months ago COVID-19 virus infection   [place] [facility] at [facility], Nyoka Cowden, MD   11 months ago Annual physical exam   [place] [facility] at [facility], Nyoka Cowden, MD   1 year ago Type 1 diabetes mellitus with other specified complication [facility])   [place] [facility] at [facility], Nyoka Cowden, MD      Future Appointments            In 1 month Judithann Graves, Nyoka Cowden, MD [facility] at [facility]

## 2022-01-27 ENCOUNTER — Other Ambulatory Visit
Admission: RE | Admit: 2022-01-27 | Discharge: 2022-01-27 | Disposition: A | Discharge status: Home / Self Care | Payer: BC Managed Care – PPO | Attending: Internal Medicine | Admitting: Internal Medicine

## 2022-01-27 ENCOUNTER — Encounter: Payer: Self-pay | Admitting: Internal Medicine

## 2022-01-27 ENCOUNTER — Ambulatory Visit (INDEPENDENT_AMBULATORY_CARE_PROVIDER_SITE_OTHER): Payer: BC Managed Care – PPO | Admitting: Internal Medicine

## 2022-01-27 VITALS — BP 112/82 | HR 83 | Ht 73.0 in | Wt 260.0 lb

## 2022-01-27 DIAGNOSIS — E785 Hyperlipidemia, unspecified: Secondary | ICD-10-CM | POA: Diagnosis not present

## 2022-01-27 DIAGNOSIS — E1069 Type 1 diabetes mellitus with other specified complication: Secondary | ICD-10-CM

## 2022-01-27 DIAGNOSIS — Z23 Encounter for immunization: Secondary | ICD-10-CM | POA: Diagnosis not present

## 2022-01-27 DIAGNOSIS — Z0000 Encounter for general adult medical examination without abnormal findings: Secondary | ICD-10-CM

## 2022-01-27 DIAGNOSIS — E041 Nontoxic single thyroid nodule: Secondary | ICD-10-CM

## 2022-01-27 DIAGNOSIS — M25672 Stiffness of left ankle, not elsewhere classified: Secondary | ICD-10-CM

## 2022-01-27 LAB — LIPID PANEL
Cholesterol: 129 mg/dL (ref 0–200)
HDL: 59 mg/dL
LDL Cholesterol: 65 mg/dL (ref 0–99)
Total CHOL/HDL Ratio: 2.2 ratio
Triglycerides: 27 mg/dL
VLDL: 5 mg/dL (ref 0–40)

## 2022-01-27 LAB — CBC WITH DIFFERENTIAL/PLATELET
Abs Immature Granulocytes: 0.03 10*3/uL (ref 0.00–0.07)
Basophils Absolute: 0 10*3/uL (ref 0.0–0.1)
Basophils Relative: 1 %
Eosinophils Absolute: 0.2 10*3/uL (ref 0.0–0.5)
Eosinophils Relative: 3 %
HCT: 44.8 % (ref 39.0–52.0)
Hemoglobin: 16.1 g/dL (ref 13.0–17.0)
Immature Granulocytes: 1 %
Lymphocytes Relative: 30 %
Lymphs Abs: 1.7 10*3/uL (ref 0.7–4.0)
MCH: 31.9 pg (ref 26.0–34.0)
MCHC: 35.9 g/dL (ref 30.0–36.0)
MCV: 88.7 fL (ref 80.0–100.0)
Monocytes Absolute: 0.5 10*3/uL (ref 0.1–1.0)
Monocytes Relative: 9 %
Neutro Abs: 3.3 10*3/uL (ref 1.7–7.7)
Neutrophils Relative %: 56 %
Platelets: 166 10*3/uL (ref 150–400)
RBC: 5.05 MIL/uL (ref 4.22–5.81)
RDW: 11.9 % (ref 11.5–15.5)
WBC: 5.7 10*3/uL (ref 4.0–10.5)
nRBC: 0 % (ref 0.0–0.2)

## 2022-01-27 LAB — COMPREHENSIVE METABOLIC PANEL
ALT: 28 U/L (ref 0–44)
AST: 25 U/L (ref 15–41)
Albumin: 4.2 g/dL (ref 3.5–5.0)
Alkaline Phosphatase: 66 U/L (ref 38–126)
Anion gap: 4 — ABNORMAL LOW (ref 5–15)
BUN: 15 mg/dL (ref 6–20)
CO2: 26 mmol/L (ref 22–32)
Calcium: 9.3 mg/dL (ref 8.9–10.3)
Chloride: 104 mmol/L (ref 98–111)
Creatinine, Ser: 1.01 mg/dL (ref 0.61–1.24)
GFR, Estimated: >60 mL/min (ref >60)
Glucose, Bld: 207 mg/dL — ABNORMAL HIGH (ref 70–99)
Potassium: 4.5 mmol/L (ref 3.5–5.1)
Sodium: 134 mmol/L — ABNORMAL LOW (ref 135–145)
Total Bilirubin: 1 mg/dL (ref 0.3–1.2)
Total Protein: 7.7 g/dL (ref 6.5–8.1)

## 2022-01-27 LAB — T4, FREE: Free T4: 1.08 ng/dL (ref 0.61–1.12)

## 2022-01-27 LAB — HEMOGLOBIN A1C
Hgb A1c MFr Bld: 6.9 % — ABNORMAL HIGH (ref 4.8–5.6)
Mean Plasma Glucose: 151.33 mg/dL

## 2022-01-27 LAB — TSH: TSH: 0.712 u[IU]/mL (ref 0.350–4.500)

## 2022-01-27 NOTE — Progress Notes (Signed)
Date:  01/27/2022   Name:  Darius Morrison   DOB:  [birth year]   MRN:  [id number]   Chief Complaint: Annual Exam Darius Morrison is a 46 y.o. male who presents today for his Complete Annual Exam. He feels well. He reports exercising walking at work. He reports he is sleeping well.  He has changed jobs and is doing much more walking.  Colonoscopy: none  Immunization History  Administered Date(s) Administered   Influenza, Seasonal, Injecte, Preservative Fre 03/05/2018   Influenza,inj,Quad PF,6+ Mos 01/16/2019, 01/20/2021   Influenza-Unspecified 03/02/2020   PFIZER(Purple Top)SARS-COV-2 Vaccination 07/09/2019, 08/06/2019   Pneumococcal Polysaccharide-23 03/13/2014   Tdap 09/10/2012   Health Maintenance Due  Topic Date Due   INFLUENZA VACCINE  11/30/2021   Diabetic kidney evaluation - GFR measurement  01/20/2022   FOOT EXAM  01/20/2022    Lab Results  Component Value Date   PSA1 0.5 01/20/2021   PSA1 0.7 08/15/2019    HPI  Lab Results  Component Value Date   NA 137 01/20/2021   K 4.9 01/20/2021   CO2 24 01/20/2021   GLUCOSE 203 (H) 01/20/2021   BUN 14 01/20/2021   CREATININE 1.07 01/20/2021   CALCIUM 9.7 01/20/2021   EGFR 86 01/20/2021   GFRNONAA 81 04/13/2020   Lab Results  Component Value Date   CHOL 125 01/20/2021   HDL 62 01/20/2021   LDLCALC 54 01/20/2021   TRIG 35 01/20/2021   CHOLHDL 2.0 01/20/2021   Lab Results  Component Value Date   TSH 0.936 08/15/2019   Lab Results  Component Value Date   HGBA1C 6.6 (A) 09/21/2021   Lab Results  Component Value Date   WBC 6.6 01/20/2021   HGB 16.2 01/20/2021   HCT 46.1 01/20/2021   MCV 90 01/20/2021   PLT 170 01/20/2021   Lab Results  Component Value Date   ALT 26 01/20/2021   AST 23 01/20/2021   ALKPHOS 75 01/20/2021   BILITOT 0.5 01/20/2021   No results found for: "25OHVITD2", "25OHVITD3", "VD25OH"   Review of Systems  Constitutional:  Negative for appetite change, chills, diaphoresis,  fatigue and unexpected weight change.  HENT:  Negative for hearing loss, tinnitus, trouble swallowing and voice change.   Eyes:  Negative for visual disturbance.  Respiratory:  Negative for choking, shortness of breath and wheezing.   Cardiovascular:  Negative for chest pain, palpitations and leg swelling.  Gastrointestinal:  Negative for abdominal pain, blood in stool, constipation and diarrhea.  Genitourinary:  Negative for difficulty urinating, dysuria and frequency.  Musculoskeletal:  Positive for arthralgias (left ankle feels tight). Negative for back pain and myalgias.  Skin:  Negative for color change and rash.  Neurological:  Negative for dizziness, syncope and headaches.  Hematological:  Negative for adenopathy.  Psychiatric/Behavioral:  Negative for dysphoric mood and sleep disturbance. The patient is not nervous/anxious.     Patient Active Problem List   Diagnosis Date Noted   Type 1 diabetes mellitus with other specified complication ([place]) 68/04/7516   Carpal tunnel syndrome on both sides 04/10/2019   Hyperlipidemia LDL goal <100 09/26/2017   Thyroid nodule 2017   ED (erectile dysfunction) 03/13/2013   OSA on CPAP 11/22/2011    No Known Allergies  Past Surgical History:  Procedure Laterality Date   CYST REMOVAL TRUNK  2000    Social History   Tobacco Use   Smoking status: Never   Smokeless tobacco: Never  Vaping Use   Vaping Use: Never  used  Substance Use Topics   Alcohol use: Yes    Comment: rarely   Drug use: No     Medication list has been reviewed and updated.  Current Meds  Medication Sig   aspirin EC 81 MG tablet Take 81 mg by mouth daily.   atorvastatin (LIPITOR) 10 MG tablet TAKE (1) TABLET BY MOUTH EVERY DAY   Continuous Blood Gluc Sensor (DEXCOM G6 SENSOR) MISC Place 1 each onto the skin continuous.   Continuous Blood Gluc Transmit (DEXCOM G6 TRANSMITTER) MISC 1 each by Does not apply route daily.   Insulin Aspart FlexPen (NOVOLOG) 100  UNIT/ML ADMINISTER 34 UNITS UNDER THE SKIN THREE TIMES DAILY WITH MEALS   Insulin Glargine (BASAGLAR KWIKPEN) 100 UNIT/ML INJECT 50 UNITS INTO THE SKIN AT BEDTIME.   lisinopril (ZESTRIL) 5 MG tablet TAKE (1) TABLET BY MOUTH EVERY DAY   NON FORMULARY CPAP nightly @ 7 cm H2O   sildenafil (REVATIO) 20 MG tablet TAKE (1) TABLET BY MOUTH EVERY DAY AS NEEDED       01/27/2022    9:18 AM 09/21/2021    2:41 PM 05/24/2021   10:16 AM 04/28/2021    3:37 PM  GAD 7 : Generalized Anxiety Score  Nervous, Anxious, on Edge 0 0 0 0  Control/stop worrying 0 0 0 0  Worry too much - different things 0 0 0 0  Trouble relaxing 0 0 0 0  Restless 0 0 0 0  Easily annoyed or irritable 0 0 0 0  Afraid - awful might happen 0 0 0 0  Total GAD 7 Score 0 0 0 0  Anxiety Difficulty Not difficult at all Not difficult at all Not difficult at all Not difficult at all       01/27/2022    9:18 AM 09/21/2021    2:41 PM 05/24/2021   10:16 AM  Depression screen PHQ 2/9  Decreased Interest 0 0 0  Down, Depressed, Hopeless 0 0 0  PHQ - 2 Score 0 0 0  Altered sleeping 0 0 0  Tired, decreased energy 0 0 0  Change in appetite 0 0 0  Feeling bad or failure about yourself  0 0 0  Trouble concentrating 0 0 0  Moving slowly or fidgety/restless 0 0 0  Suicidal thoughts 0 0 0  PHQ-9 Score 0 0 0  Difficult doing work/chores Not difficult at all Not difficult at all Not difficult at all    BP Readings from Last 3 Encounters:  01/27/22 112/82  09/21/21 124/82  07/17/21 107/77    Physical Exam Vitals and nursing note reviewed.  Constitutional:      Appearance: Normal appearance. He is well-developed.  HENT:     Head: Normocephalic.     Right Ear: Tympanic membrane, ear canal and external ear normal.     Left Ear: Tympanic membrane, ear canal and external ear normal.     Nose: Nose normal.  Eyes:     Conjunctiva/sclera: Conjunctivae normal.     Pupils: Pupils are equal, round, and reactive to light.  Neck:      Thyroid: No thyromegaly.     Vascular: No carotid bruit.  Cardiovascular:     Rate and Rhythm: Normal rate and regular rhythm.     Pulses: Normal pulses.     Heart sounds: Normal heart sounds.  Pulmonary:     Effort: Pulmonary effort is normal.     Breath sounds: Normal breath sounds. No wheezing.  Chest:  Breasts:  Right: No mass.     Left: No mass.  Abdominal:     General: Bowel sounds are normal.     Palpations: Abdomen is soft.     Tenderness: There is no abdominal tenderness.  Musculoskeletal:        General: Normal range of motion.     Cervical back: Normal range of motion and neck supple.     Right lower leg: No edema.     Left lower leg: No edema.  Lymphadenopathy:     Cervical: No cervical adenopathy.  Skin:    General: Skin is warm and dry.     Capillary Refill: Capillary refill takes less than 2 seconds.     Findings: No lesion.  Neurological:     General: No focal deficit present.     Mental Status: He is alert and oriented to person, place, and time.     Deep Tendon Reflexes: Reflexes are normal and symmetric.  Psychiatric:        Attention and Perception: Attention normal.        Mood and Affect: Mood normal.        Thought Content: Thought content normal.     Wt Readings from Last 3 Encounters:  01/27/22 260 lb (117.9 kg)  09/21/21 266 lb (120.7 kg)  07/17/21 260 lb (117.9 kg)    BP 112/82   Pulse 83   Ht 6' 1"  (1.854 m)   Wt 260 lb (117.9 kg)   SpO2 96%   BMI 34.30 kg/m   Assessment and Plan: 1. Annual physical exam Exam is normal except for weight. Continue regular exercise and dietary changes. Up to date on screenings and immunizations. - CBC with Differential/Platelet - Comprehensive metabolic panel - Hemoglobin A1c - Lipid panel  2. Type 1 diabetes mellitus with other specified complication (HCC) Doing well with DEXCOM 6 Has lost 5 lbs with recent diet and activity changes - CBC with Differential/Platelet - Comprehensive  metabolic panel - Hemoglobin A1c - Microalbumin / creatinine urine ratio  3. Hyperlipidemia LDL goal <100 - Comprehensive metabolic panel - Lipid panel  4. Thyroid nodule Symptoms of mild fullness intermittently - TSH + free T4  5. Need for immunization against influenza - Flu Vaccine QUAD 18moIM (Fluarix, Fluzone & Alfiuria Quad PF)  6. Ankle stiffness, left Appears to be achilles tendon and calf muscle origin Recommend regular stretching and topical rubs if needed   Partially dictated using DEditor, commissioning Any errors are unintentional.  LHalina Maidens MD [facility]  01/27/2022

## 2022-01-28 LAB — MICROALBUMIN / CREATININE URINE RATIO
Creatinine, Urine: 186.2 mg/dL
Microalb Creat Ratio: 2 mg/g creat (ref 0–29)
Microalb, Ur: 3 ug/mL — ABNORMAL HIGH

## 2022-03-08 ENCOUNTER — Encounter: Payer: Self-pay | Admitting: Internal Medicine

## 2022-03-08 ENCOUNTER — Other Ambulatory Visit: Payer: Self-pay

## 2022-03-08 MED ORDER — DEXCOM G6 TRANSMITTER MISC: 1.0000 | Freq: Every day | 1 refills | Status: DC

## 2022-04-20 ENCOUNTER — Encounter: Payer: Self-pay | Admitting: Internal Medicine

## 2022-04-21 ENCOUNTER — Other Ambulatory Visit: Payer: Self-pay | Admitting: Internal Medicine

## 2022-04-21 DIAGNOSIS — E1069 Type 1 diabetes mellitus with other specified complication: Secondary | ICD-10-CM

## 2022-04-21 MED ORDER — LANTUS SOLOSTAR 100 UNIT/ML ~~LOC~~ SOPN: 50.0000 [IU] | PEN_INJECTOR | Freq: Every day | SUBCUTANEOUS | 3 refills | Status: DC

## 2022-04-21 MED ORDER — INSULIN LISPRO (1 UNIT DIAL) 100 UNIT/ML (KWIKPEN): 34.0000 [IU] | PEN_INJECTOR | Freq: Three times a day (TID) | SUBCUTANEOUS | 1 refills | Status: DC

## 2022-06-06 ENCOUNTER — Ambulatory Visit: Payer: BC Managed Care – PPO | Admitting: Internal Medicine

## 2022-06-06 ENCOUNTER — Encounter: Payer: Self-pay | Admitting: Internal Medicine

## 2022-06-06 VITALS — BP 112/78 | HR 87 | Ht 73.0 in | Wt 265.0 lb

## 2022-06-06 DIAGNOSIS — E1069 Type 1 diabetes mellitus with other specified complication: Secondary | ICD-10-CM

## 2022-06-06 LAB — POCT GLYCOSYLATED HEMOGLOBIN (HGB A1C): Hemoglobin A1C: 6.9 % — AB (ref 4.0–5.6)

## 2022-06-06 NOTE — Assessment & Plan Note (Addendum)
Clinically stable without s/s of hypoglycemia. Insulin regimen is now Lantus and Humalog (adjusted dose) Using DEXCOM regularly.  No severe hypoglycemia episodes. Eye exam up to date Last A1C 6.9 A1C today stable at 6.9

## 2022-06-06 NOTE — Progress Notes (Signed)
Date:  06/06/2022   Name:  Darius Morrison   DOB:  10/30/1975   MRN:  [id number]   Chief Complaint: Diabetes  Diabetes He presents for his follow-up diabetic visit. He has type 1 diabetes mellitus. His disease course has been stable. Hypoglycemia symptoms include sweats. Pertinent negatives for hypoglycemia include no headaches, nervousness/anxiousness or tremors. Pertinent negatives for diabetes include no chest pain, no fatigue, no polydipsia and no polyuria. There are no hypoglycemic complications. Symptoms are stable. Current diabetic treatment includes intensive insulin program. He is compliant with treatment all of the time. Eye exam is current.  He has been adjusting his insulin dose, esp at lunch due to an increase in physical activity and tendency for BS to run low during the day.  No recent nocturnal low readings.  He can treat the low BS with soda or a cookie.  Lab Results  Component Value Date   NA 134 (L) 01/27/2022   K 4.5 01/27/2022   CO2 26 01/27/2022   GLUCOSE 207 (H) 01/27/2022   BUN 15 01/27/2022   CREATININE 1.01 01/27/2022   CALCIUM 9.3 01/27/2022   EGFR 86 01/20/2021   GFRNONAA >60 01/27/2022   Lab Results  Component Value Date   CHOL 129 01/27/2022   HDL 59 01/27/2022   LDLCALC 65 01/27/2022   TRIG 27 01/27/2022   CHOLHDL 2.2 01/27/2022   Lab Results  Component Value Date   TSH 0.712 01/27/2022   Lab Results  Component Value Date   HGBA1C 6.9 (A) 06/06/2022   Lab Results  Component Value Date   WBC 5.7 01/27/2022   HGB 16.1 01/27/2022   HCT 44.8 01/27/2022   MCV 88.7 01/27/2022   PLT 166 01/27/2022   Lab Results  Component Value Date   ALT 28 01/27/2022   AST 25 01/27/2022   ALKPHOS 66 01/27/2022   BILITOT 1.0 01/27/2022   No results found for: "25OHVITD2", "25OHVITD3", "VD25OH"   Review of Systems  Constitutional:  Negative for appetite change, fatigue and unexpected weight change.  Eyes:  Negative for visual disturbance.   Respiratory:  Negative for cough, shortness of breath and wheezing.   Cardiovascular:  Negative for chest pain, palpitations and leg swelling.  Gastrointestinal:  Negative for abdominal pain and blood in stool.  Endocrine: Negative for polydipsia and polyuria.  Genitourinary:  Negative for dysuria and hematuria.  Skin:  Negative for color change and rash.  Neurological:  Negative for tremors, numbness and headaches.  Psychiatric/Behavioral:  Negative for dysphoric mood and sleep disturbance. The patient is not nervous/anxious.     Patient Active Problem List   Diagnosis Date Noted   Type 1 diabetes mellitus with other specified complication ([place]) 85/27/7824   Carpal tunnel syndrome on both sides 04/10/2019   Hyperlipidemia LDL goal <100 09/26/2017   Thyroid nodule 2017   ED (erectile dysfunction) 03/13/2013   OSA on CPAP 11/22/2011    No Known Allergies  Past Surgical History:  Procedure Laterality Date   CYST REMOVAL TRUNK  2000    Social History   Tobacco Use   Smoking status: Never   Smokeless tobacco: Never  Vaping Use   Vaping Use: Never used  Substance Use Topics   Alcohol use: Yes    Comment: rarely   Drug use: No     Medication list has been reviewed and updated.  Current Meds  Medication Sig   aspirin EC 81 MG tablet Take 81 mg by mouth daily.  atorvastatin (LIPITOR) 10 MG tablet TAKE (1) TABLET BY MOUTH EVERY DAY   Continuous Blood Gluc Sensor (DEXCOM G6 SENSOR) MISC Place 1 each onto the skin continuous.   Continuous Blood Gluc Transmit (DEXCOM G6 TRANSMITTER) MISC 1 each by Does not apply route daily.   insulin glargine (LANTUS SOLOSTAR) 100 UNIT/ML Solostar Pen Inject 50 Units into the skin at bedtime.   insulin lispro (HUMALOG KWIKPEN) 100 UNIT/ML KwikPen Inject 34 Units into the skin 3 (three) times daily.   lisinopril (ZESTRIL) 5 MG tablet TAKE (1) TABLET BY MOUTH EVERY DAY   NON FORMULARY CPAP nightly @ 7 cm H2O   sildenafil (REVATIO) 20 MG  tablet TAKE (1) TABLET BY MOUTH EVERY DAY AS NEEDED       06/06/2022    8:14 AM 06/06/2022    8:06 AM 01/27/2022    9:18 AM 09/21/2021    2:41 PM  GAD 7 : Generalized Anxiety Score  Nervous, Anxious, on Edge 0 0 0 0  Control/stop worrying 0 0 0 0  Worry too much - different things 0 0 0 0  Trouble relaxing 0 0 0 0  Restless 0 0 0 0  Easily annoyed or irritable 0 0 0 0  Afraid - awful might happen 0 0 0 0  Total GAD 7 Score 0 0 0 0  Anxiety Difficulty Not difficult at all Not difficult at all Not difficult at all Not difficult at all       06/06/2022    8:13 AM 06/06/2022    8:06 AM 01/27/2022    9:18 AM  Depression screen PHQ 2/9  Decreased Interest 0 0 0  Down, Depressed, Hopeless 0 0 0  PHQ - 2 Score 0 0 0  Altered sleeping 0 0 0  Tired, decreased energy 0 0 0  Change in appetite 0 0 0  Feeling bad or failure about yourself  0 0 0  Trouble concentrating 0 0 0  Moving slowly or fidgety/restless 0 0 0  Suicidal thoughts 0 0 0  PHQ-9 Score 0 0 0  Difficult doing work/chores Not difficult at all Not difficult at all Not difficult at all    BP Readings from Last 3 Encounters:  06/06/22 112/78  01/27/22 112/82  09/21/21 124/82    Physical Exam Vitals and nursing note reviewed.  Constitutional:      General: He is not in acute distress.    Appearance: He is well-developed.  HENT:     Head: Normocephalic and atraumatic.  Cardiovascular:     Rate and Rhythm: Normal rate and regular rhythm.  Pulmonary:     Effort: Pulmonary effort is normal. No respiratory distress.     Breath sounds: No wheezing or rhonchi.  Musculoskeletal:     Cervical back: Normal range of motion.     Right lower leg: No edema.     Left lower leg: No edema.  Lymphadenopathy:     Cervical: No cervical adenopathy.  Skin:    General: Skin is warm and dry.     Findings: No rash.  Neurological:     Mental Status: He is alert and oriented to person, place, and time.  Psychiatric:        Mood and  Affect: Mood normal.        Behavior: Behavior normal.     Wt Readings from Last 3 Encounters:  06/06/22 265 lb (120.2 kg)  01/27/22 260 lb (117.9 kg)  09/21/21 266 lb (120.7 kg)  BP 112/78   Pulse 87   Ht 6\' 1"  (1.854 m)   Wt 265 lb (120.2 kg)   SpO2 97%   BMI 34.96 kg/m   Assessment and Plan: Problem List Items Addressed This Visit       Endocrine   Type 1 diabetes mellitus with other specified complication ([place]) - Primary (Chronic)    Clinically stable without s/s of hypoglycemia. Insulin regimen is now Lantus and Humalog (adjusted dose) Using DEXCOM regularly.  No severe hypoglycemia episodes. Eye exam up to date Last A1C 6.9 A1C today stable at 6.9       Relevant Orders   POCT glycosylated hemoglobin (Hb A1C) (Completed)     Partially dictated using Editor, commissioning. Any errors are unintentional.  Halina Maidens, MD [facility]  06/06/2022

## 2022-07-26 ENCOUNTER — Other Ambulatory Visit: Payer: Self-pay | Admitting: Internal Medicine

## 2022-07-26 ENCOUNTER — Other Ambulatory Visit: Payer: Self-pay

## 2022-07-26 ENCOUNTER — Encounter: Payer: Self-pay | Admitting: Internal Medicine

## 2022-07-26 DIAGNOSIS — E1069 Type 1 diabetes mellitus with other specified complication: Secondary | ICD-10-CM

## 2022-07-26 DIAGNOSIS — E785 Hyperlipidemia, unspecified: Secondary | ICD-10-CM

## 2022-07-26 MED ORDER — ATORVASTATIN CALCIUM 10 MG PO TABS: ORAL_TABLET | ORAL | 1 refills | Status: DC

## 2022-07-27 NOTE — Telephone Encounter (Signed)
Refused atorvastatin 10 mg because this is a duplicate request.  Sent on 07/26/2022.

## 2022-07-27 NOTE — Telephone Encounter (Signed)
Requested Prescriptions  Pending Prescriptions Disp Refills   LANTUS SOLOSTAR 100 UNIT/ML Solostar Pen [Pharmacy Med Name: LANTUS SOLOSTAR PEN INJ 3ML] 15 mL 0    Sig: ADMINISTER 50 UNITS UNDER THE SKIN AT BEDTIME     Endocrinology:  Diabetes - Insulins Passed - 07/26/2022 12:45 PM      Passed - HBA1C is between 0 and 7.9 and within 180 days    Hemoglobin A1C  Date Value Ref Range Status  06/06/2022 6.9 (A) 4.0 - 5.6 % Final  10/10/2018 7.2  Final   Hgb A1c MFr Bld  Date Value Ref Range Status  01/27/2022 6.9 (H) 4.8 - 5.6 % Final    Comment:    (NOTE) Pre diabetes:          5.7%-6.4%  Diabetes:              >6.4%  Glycemic control for   <7.0% adults with diabetes          Passed - Valid encounter within last 6 months    Recent Outpatient Visits           1 month ago Type 1 diabetes mellitus with other specified complication [facility])   [place] [facility] at [place], Jesse Sans, MD   6 months ago Annual physical exam   [place] at [facility], Jesse Sans, MD   10 months ago Type 1 diabetes mellitus with other specified complication [facility])   [place] [facility] at [facility], Jesse Sans, MD   1 year ago Type 1 diabetes mellitus with other specified complication [facility])   [place] at [facility], Jesse Sans, MD   1 year ago COVID-19 virus infection   [facility] Primary [place] at [facility], Jesse Sans, MD       Future Appointments             In 2 months Army Melia, Jesse Sans, MD [place] at [facility], [facility]   In 6 months Army Melia, Jesse Sans, MD [place] at [facility], [facility]

## 2022-09-21 ENCOUNTER — Other Ambulatory Visit: Payer: Self-pay | Admitting: Internal Medicine

## 2022-10-01 ENCOUNTER — Other Ambulatory Visit: Payer: Self-pay | Admitting: Internal Medicine

## 2022-10-01 DIAGNOSIS — E1069 Type 1 diabetes mellitus with other specified complication: Secondary | ICD-10-CM

## 2022-10-03 NOTE — Telephone Encounter (Signed)
Requested medication (s) are due for refill today: yes  Requested medication (s) are on the active medication list: yes  Last refill:  09/21/21 #90 3 RF  Future visit scheduled: yes  Notes to clinic:  overdue lab work    Requested Prescriptions  Pending Prescriptions Disp Refills   lisinopril (ZESTRIL) 5 MG tablet [Pharmacy Med Name: LISINOPRIL TAB 5MG ] 90 tablet 3    Sig: TAKE ONE (1) TABLET BY MOUTH ONCE DAILY     Cardiovascular:  ACE Inhibitors Failed - 10/01/2022  9:27 AM      Failed - Cr in normal range and within 180 days    Creatinine, Ser  Date Value Ref Range Status  01/27/2022 1.01 0.61 - 1.24 mg/dL Final         Failed - K in normal range and within 180 days    Potassium  Date Value Ref Range Status  01/27/2022 4.5 3.5 - 5.1 mmol/L Final         Passed - Patient is not pregnant      Passed - Last BP in normal range    BP Readings from Last 1 Encounters:  06/06/22 112/78         Passed - Valid encounter within last 6 months    Recent Outpatient Visits           3 months ago Type 1 diabetes mellitus with other specified complication [facility])   [place] [facility] Medicine at [facility], Nyoka Cowden, MD   8 months ago Annual physical exam   [facility] Primary Care & Sports Medicine at [facility], Nyoka Cowden, MD   1 year ago Type 1 diabetes mellitus with other specified complication [facility])   [place] Primary Care [facility] at [facility], Nyoka Cowden, MD   1 year ago Type 1 diabetes mellitus with other specified complication [facility])    Primary Care & Sports Medicine at [facility], Nyoka Cowden, MD   1 year ago COVID-19 virus infection   [facility] at [facility], Nyoka Cowden, MD       Future Appointments             In 2 days Judithann Graves, Nyoka Cowden, MD [facility] Primary Care & Sports Medicine at [facility], [facility]   In 4 months Judithann Graves,  Nyoka Cowden, MD [facility] Primary Care & Sports Medicine at [facility], [facility]

## 2022-10-05 ENCOUNTER — Encounter: Payer: Self-pay | Admitting: Internal Medicine

## 2022-10-05 ENCOUNTER — Ambulatory Visit (INDEPENDENT_AMBULATORY_CARE_PROVIDER_SITE_OTHER): Payer: BC Managed Care – PPO | Admitting: Internal Medicine

## 2022-10-05 VITALS — BP 112/62 | HR 87 | Ht 73.0 in | Wt 260.0 lb

## 2022-10-05 DIAGNOSIS — E1069 Type 1 diabetes mellitus with other specified complication: Secondary | ICD-10-CM | POA: Diagnosis not present

## 2022-10-05 DIAGNOSIS — H9190 Unspecified hearing loss, unspecified ear: Secondary | ICD-10-CM | POA: Diagnosis not present

## 2022-10-05 DIAGNOSIS — Z23 Encounter for immunization: Secondary | ICD-10-CM

## 2022-10-05 NOTE — Assessment & Plan Note (Addendum)
Blood sugars stable without hypoglycemic symptoms or events.  He feels like his glucoses are improved and he is using less Humalog than previously.  No hypoglycemic symptoms. Currently being treated with insulin injections. Lab Results  Component Value Date   HGBA1C 6.9 (A) 06/06/2022  Eye exam due in July. A1C today 7.2.

## 2022-10-05 NOTE — Progress Notes (Signed)
Date:  10/05/2022   Name:  Darius Morrison   DOB:  June 30, 1975   MRN:  [id number]   Chief Complaint: Diabetes  Diabetes He presents for his follow-up diabetic visit. He has type 1 diabetes mellitus. His disease course has been stable. Pertinent negatives for hypoglycemia include no headaches, nervousness/anxiousness or tremors. Pertinent negatives for diabetes include no chest pain, no fatigue, no polydipsia and no polyuria.  Ear Fullness  There is pain in both (flushed some wax from left ear) ears. This is a new problem. Pertinent negatives include no abdominal pain, coughing, headaches or rash. Hearing loss: possible decrease in hearing.   Lab Results  Component Value Date   NA 134 (L) 01/27/2022   K 4.5 01/27/2022   CO2 26 01/27/2022   GLUCOSE 207 (H) 01/27/2022   BUN 15 01/27/2022   CREATININE 1.01 01/27/2022   CALCIUM 9.3 01/27/2022   EGFR 86 01/20/2021   GFRNONAA >60 01/27/2022   Lab Results  Component Value Date   CHOL 129 01/27/2022   HDL 59 01/27/2022   LDLCALC 65 01/27/2022   TRIG 27 01/27/2022   CHOLHDL 2.2 01/27/2022   Lab Results  Component Value Date   TSH 0.712 01/27/2022   Lab Results  Component Value Date   HGBA1C 6.9 (A) 06/06/2022   Lab Results  Component Value Date   WBC 5.7 01/27/2022   HGB 16.1 01/27/2022   HCT 44.8 01/27/2022   MCV 88.7 01/27/2022   PLT 166 01/27/2022   Lab Results  Component Value Date   ALT 28 01/27/2022   AST 25 01/27/2022   ALKPHOS 66 01/27/2022   BILITOT 1.0 01/27/2022   No results found for: "25OHVITD2", "25OHVITD3", "VD25OH"   Review of Systems  Constitutional:  Negative for appetite change, fatigue and unexpected weight change.  HENT:  Hearing loss: possible decrease in hearing.   Eyes:  Negative for visual disturbance.  Respiratory:  Negative for cough, shortness of breath and wheezing.   Cardiovascular:  Negative for chest pain, palpitations and leg swelling.  Gastrointestinal:  Negative for abdominal  pain and blood in stool.  Endocrine: Negative for polydipsia and polyuria.  Genitourinary:  Negative for dysuria and hematuria.  Skin:  Negative for color change and rash.  Neurological:  Negative for tremors, numbness and headaches.  Psychiatric/Behavioral:  Negative for dysphoric mood and sleep disturbance. The patient is not nervous/anxious.     Patient Active Problem List   Diagnosis Date Noted   Type 1 diabetes mellitus with other specified complication (HCC) 09/02/2020   Carpal tunnel syndrome on both sides 04/10/2019   Hyperlipidemia LDL goal <100 09/26/2017   Thyroid nodule 2017   ED (erectile dysfunction) 03/13/2013   OSA on CPAP 11/22/2011    No Known Allergies  Past Surgical History:  Procedure Laterality Date   CYST REMOVAL TRUNK  2000    Social History   Tobacco Use   Smoking status: Never   Smokeless tobacco: Never  Vaping Use   Vaping Use: Never used  Substance Use Topics   Alcohol use: Yes    Comment: rarely   Drug use: No     Medication list has been reviewed and updated.  Current Meds  Medication Sig   aspirin EC 81 MG tablet Take 81 mg by mouth daily.   atorvastatin (LIPITOR) 10 MG tablet TAKE (1) TABLET BY MOUTH EVERY DAY   Continuous Blood Gluc Sensor (DEXCOM G6 SENSOR) MISC Place 1 each onto the skin continuous.  Continuous Glucose Transmitter (DEXCOM G6 TRANSMITTER) MISC USE DAILY AS DIRECTED   insulin glargine (LANTUS SOLOSTAR) 100 UNIT/ML Solostar Pen ADMINISTER 50 UNITS UNDER THE SKIN AT BEDTIME   insulin lispro (HUMALOG KWIKPEN) 100 UNIT/ML KwikPen Inject 34 Units into the skin 3 (three) times daily.   lisinopril (ZESTRIL) 5 MG tablet TAKE ONE (1) TABLET BY MOUTH ONCE DAILY   NON FORMULARY CPAP nightly @ 7 cm H2O   sildenafil (REVATIO) 20 MG tablet TAKE (1) TABLET BY MOUTH EVERY DAY AS NEEDED       10/05/2022    4:08 PM 06/06/2022    8:14 AM 06/06/2022    8:06 AM 01/27/2022    9:18 AM  GAD 7 : Generalized Anxiety Score  Nervous,  Anxious, on Edge 0 0 0 0  Control/stop worrying 0 0 0 0  Worry too much - different things 0 0 0 0  Trouble relaxing 0 0 0 0  Restless 0 0 0 0  Easily annoyed or irritable 0 0 0 0  Afraid - awful might happen 0 0 0 0  Total GAD 7 Score 0 0 0 0  Anxiety Difficulty Not difficult at all Not difficult at all Not difficult at all Not difficult at all       10/05/2022    4:08 PM 06/06/2022    8:13 AM 06/06/2022    8:06 AM  Depression screen PHQ 2/9  Decreased Interest 0 0 0  Down, Depressed, Hopeless 0 0 0  PHQ - 2 Score 0 0 0  Altered sleeping 0 0 0  Tired, decreased energy 0 0 0  Change in appetite 0 0 0  Feeling bad or failure about yourself  0 0 0  Trouble concentrating 0 0 0  Moving slowly or fidgety/restless 0 0 0  Suicidal thoughts 0 0 0  PHQ-9 Score 0 0 0  Difficult doing work/chores Not difficult at all Not difficult at all Not difficult at all    BP Readings from Last 3 Encounters:  10/05/22 112/62  06/06/22 112/78  01/27/22 112/82    Physical Exam Vitals and nursing note reviewed.  Constitutional:      General: He is not in acute distress.    Appearance: He is well-developed.  HENT:     Head: Normocephalic and atraumatic.     Right Ear: Tympanic membrane and ear canal normal.     Left Ear: Tympanic membrane and ear canal normal.  Neck:     Thyroid: No thyroid mass.  Cardiovascular:     Rate and Rhythm: Normal rate and regular rhythm.     Heart sounds: Normal heart sounds.  Pulmonary:     Effort: Pulmonary effort is normal. No respiratory distress.     Breath sounds: Normal breath sounds. No decreased breath sounds or wheezing.  Musculoskeletal:     Right lower leg: No edema.     Left lower leg: No edema.  Lymphadenopathy:     Cervical: No cervical adenopathy.  Skin:    General: Skin is warm and dry.     Findings: No rash.  Neurological:     General: No focal deficit present.     Mental Status: He is alert and oriented to person, place, and time.   Psychiatric:        Mood and Affect: Mood normal.        Behavior: Behavior normal.     Wt Readings from Last 3 Encounters:  10/05/22 260 lb (117.9 kg)  06/06/22  265 lb (120.2 kg)  01/27/22 260 lb (117.9 kg)    BP 112/62   Pulse 87   Ht 6\' 1"  (1.854 m)   Wt 260 lb (117.9 kg)   SpO2 97%   BMI 34.30 kg/m   Assessment and Plan:  Problem List Items Addressed This Visit     Type 1 diabetes mellitus with other specified complication (HCC) - Primary (Chronic)    Blood sugars stable without hypoglycemic symptoms or events.  He feels like his glucoses are improved and he is using less Humalog than previously.  No hypoglycemic symptoms. Currently being treated with insulin injections. Lab Results  Component Value Date   HGBA1C 6.9 (A) 06/06/2022  Eye exam due in July. A1C today 7.2.      Other Visit Diagnoses     Need for diphtheria-tetanus-pertussis (Tdap) vaccine       Relevant Orders   Tdap vaccine greater than or equal to 7yo IM   Subjective hearing loss       exam is normal If symptoms worsen would recommend formal hearing evaluation       No follow-ups on file.   Partially dictated using Dragon software, any errors are not intentional.  Reubin Milan, MD [facility] Primary Care and Sports Medicine [place], [place]

## 2022-10-29 ENCOUNTER — Other Ambulatory Visit: Payer: Self-pay | Admitting: Internal Medicine

## 2022-10-29 DIAGNOSIS — N529 Male erectile dysfunction, unspecified: Secondary | ICD-10-CM

## 2022-12-27 LAB — HM DIABETES EYE EXAM

## 2022-12-31 ENCOUNTER — Other Ambulatory Visit: Payer: Self-pay | Admitting: Internal Medicine

## 2022-12-31 DIAGNOSIS — E1069 Type 1 diabetes mellitus with other specified complication: Secondary | ICD-10-CM

## 2023-01-03 ENCOUNTER — Encounter: Payer: Self-pay | Admitting: Internal Medicine

## 2023-01-03 NOTE — Telephone Encounter (Signed)
Requested Prescriptions  Pending Prescriptions Disp Refills   lisinopril (ZESTRIL) 5 MG tablet [Pharmacy Med Name: LISINOPRIL TAB 5MG ] 90 tablet 0    Sig: TAKE ONE (1) TABLET BY MOUTH ONCE DAILY     Cardiovascular:  ACE Inhibitors Failed - 12/31/2022  8:58 AM      Failed - Cr in normal range and within 180 days    Creatinine, Ser  Date Value Ref Range Status  01/27/2022 1.01 0.61 - 1.24 mg/dL Final         Failed - K in normal range and within 180 days    Potassium  Date Value Ref Range Status  01/27/2022 4.5 3.5 - 5.1 mmol/L Final         Passed - Patient is not pregnant      Passed - Last BP in normal range    BP Readings from Last 1 Encounters:  10/05/22 112/62         Passed - Valid encounter within last 6 months    Recent Outpatient Visits           3 months ago Type 1 diabetes mellitus with other specified complication [facility])   [place] Primary Care & Sports Medicine at [facility], Nyoka Cowden, MD   7 months ago Type 1 diabetes mellitus with other specified complication [facility])   [place] [facility] Care & Sports Medicine at [facility], Nyoka Cowden, MD   11 months ago Annual physical exam   [facility] Primary Care & Sports Medicine at [facility], Nyoka Cowden, MD   1 year ago Type 1 diabetes mellitus with other specified complication [facility])   [place] Primary Care & Sports Medicine at [facility], Nyoka Cowden, MD   1 year ago Type 1 diabetes mellitus with other specified complication [facility])   [place] Primary Care & Sports Medicine at [facility], Nyoka Cowden, MD       Future Appointments             In 4 weeks Judithann Graves Nyoka Cowden, MD [facility] Primary Care & Sports Medicine at [facility], [facility]

## 2023-01-27 ENCOUNTER — Other Ambulatory Visit: Payer: Self-pay | Admitting: Internal Medicine

## 2023-01-27 DIAGNOSIS — E1069 Type 1 diabetes mellitus with other specified complication: Secondary | ICD-10-CM

## 2023-02-01 ENCOUNTER — Encounter: Payer: Self-pay | Admitting: Internal Medicine

## 2023-02-01 ENCOUNTER — Ambulatory Visit (INDEPENDENT_AMBULATORY_CARE_PROVIDER_SITE_OTHER): Payer: BC Managed Care – PPO | Admitting: Internal Medicine

## 2023-02-01 ENCOUNTER — Other Ambulatory Visit
Admission: RE | Admit: 2023-02-01 | Discharge: 2023-02-01 | Disposition: A | Discharge status: Home / Self Care | Payer: BC Managed Care – PPO | Attending: Internal Medicine | Admitting: Internal Medicine

## 2023-02-01 VITALS — BP 126/78 | HR 90 | Ht 73.0 in | Wt 262.0 lb

## 2023-02-01 DIAGNOSIS — Z23 Encounter for immunization: Secondary | ICD-10-CM

## 2023-02-01 DIAGNOSIS — Z125 Encounter for screening for malignant neoplasm of prostate: Secondary | ICD-10-CM | POA: Insufficient documentation

## 2023-02-01 DIAGNOSIS — E1065 Type 1 diabetes mellitus with hyperglycemia: Secondary | ICD-10-CM

## 2023-02-01 DIAGNOSIS — E041 Nontoxic single thyroid nodule: Secondary | ICD-10-CM

## 2023-02-01 DIAGNOSIS — Z0000 Encounter for general adult medical examination without abnormal findings: Secondary | ICD-10-CM

## 2023-02-01 DIAGNOSIS — E1069 Type 1 diabetes mellitus with other specified complication: Secondary | ICD-10-CM

## 2023-02-01 DIAGNOSIS — E785 Hyperlipidemia, unspecified: Secondary | ICD-10-CM

## 2023-02-01 DIAGNOSIS — Z794 Long term (current) use of insulin: Secondary | ICD-10-CM

## 2023-02-01 DIAGNOSIS — Z1211 Encounter for screening for malignant neoplasm of colon: Secondary | ICD-10-CM

## 2023-02-01 LAB — CBC WITH DIFFERENTIAL/PLATELET
Abs Immature Granulocytes: 0.03 10*3/uL (ref 0.00–0.07)
Basophils Absolute: 0.1 10*3/uL (ref 0.0–0.1)
Basophils Relative: 1 %
Eosinophils Absolute: 0.2 10*3/uL (ref 0.0–0.5)
Eosinophils Relative: 4 %
HCT: 45.4 % (ref 39.0–52.0)
Hemoglobin: 15.8 g/dL (ref 13.0–17.0)
Immature Granulocytes: 1 %
Lymphocytes Relative: 31 %
Lymphs Abs: 1.9 10*3/uL (ref 0.7–4.0)
MCH: 32 pg (ref 26.0–34.0)
MCHC: 34.8 g/dL (ref 30.0–36.0)
MCV: 92.1 fL (ref 80.0–100.0)
Monocytes Absolute: 0.5 10*3/uL (ref 0.1–1.0)
Monocytes Relative: 9 %
Neutro Abs: 3.4 10*3/uL (ref 1.7–7.7)
Neutrophils Relative %: 54 %
Platelets: 172 10*3/uL (ref 150–400)
RBC: 4.93 MIL/uL (ref 4.22–5.81)
RDW: 12.3 % (ref 11.5–15.5)
WBC: 6.1 10*3/uL (ref 4.0–10.5)
nRBC: 0 % (ref 0.0–0.2)

## 2023-02-01 LAB — LIPID PANEL
Cholesterol: 139 mg/dL (ref 0–200)
HDL: 63 mg/dL (ref >40)
LDL Cholesterol: 68 mg/dL (ref 0–99)
Total CHOL/HDL Ratio: 2.2 {ratio}
Triglycerides: 39 mg/dL (ref <150)
VLDL: 8 mg/dL (ref 0–40)

## 2023-02-01 LAB — COMPREHENSIVE METABOLIC PANEL
ALT: 28 U/L (ref 0–44)
AST: 26 U/L (ref 15–41)
Albumin: 4.2 g/dL (ref 3.5–5.0)
Alkaline Phosphatase: 61 U/L (ref 38–126)
Anion gap: 7 (ref 5–15)
BUN: 15 mg/dL (ref 6–20)
CO2: 26 mmol/L (ref 22–32)
Calcium: 9.2 mg/dL (ref 8.9–10.3)
Chloride: 105 mmol/L (ref 98–111)
Creatinine, Ser: 0.93 mg/dL (ref 0.61–1.24)
GFR, Estimated: >60 mL/min (ref >60)
Glucose, Bld: 111 mg/dL — ABNORMAL HIGH (ref 70–99)
Potassium: 4.6 mmol/L (ref 3.5–5.1)
Sodium: 138 mmol/L (ref 135–145)
Total Bilirubin: 0.7 mg/dL (ref 0.3–1.2)
Total Protein: 7.3 g/dL (ref 6.5–8.1)

## 2023-02-01 LAB — HEMOGLOBIN A1C
Hgb A1c MFr Bld: 7.1 % — ABNORMAL HIGH (ref 4.8–5.6)
Mean Plasma Glucose: 157.07 mg/dL

## 2023-02-01 LAB — PSA: Prostatic Specific Antigen: 0.52 ng/mL (ref 0.00–4.00)

## 2023-02-01 LAB — TSH: TSH: 0.812 u[IU]/mL (ref 0.350–4.500)

## 2023-02-01 MED ORDER — DEXCOM G6 SENSOR MISC: 1.0000 | 3 refills | Status: DC

## 2023-02-01 MED ORDER — ATORVASTATIN CALCIUM 10 MG PO TABS: ORAL_TABLET | ORAL | 3 refills | Status: DC

## 2023-02-01 NOTE — Progress Notes (Addendum)
Date:  02/01/2023   Name:  Darius Morrison   DOB:  [birth year]   MRN:  [id number]   Chief Complaint: Annual Exam Darius Morrison is a 47 y.o. male who presents today for his Complete Annual Exam. He feels well. He reports exercising at work. He reports he is sleeping fairly well.   Colonoscopy: none  Immunization History  Administered Date(s) Administered   Influenza, Seasonal, Injecte, Preservative Fre 03/05/2018, 02/01/2023   Influenza,inj,Quad PF,6+ Mos 01/16/2019, 01/20/2021, 01/27/2022   Influenza-Unspecified 03/02/2020   PFIZER(Purple Top)SARS-COV-2 Vaccination 07/09/2019, 08/06/2019   Pneumococcal Polysaccharide-23 03/13/2014   Tdap 09/10/2012, 10/05/2022   Health Maintenance Due  Topic Date Due   HEMOGLOBIN A1C  12/05/2022   Diabetic kidney evaluation - eGFR measurement  01/28/2023   Diabetic kidney evaluation - Urine ACR  01/28/2023    Lab Results  Component Value Date   PSA1 0.5 01/20/2021   PSA1 0.7 08/15/2019    Diabetes He has type 1 diabetes mellitus. His disease course has been stable. Pertinent negatives for hypoglycemia include no dizziness, headaches or nervousness/anxiousness. Pertinent negatives for diabetes include no chest pain and no fatigue.  Hyperlipidemia This is a chronic problem. The problem is controlled. Pertinent negatives include no chest pain, myalgias or shortness of breath. Current antihyperlipidemic treatment includes statins.    Lab Results  Component Value Date   NA 134 (L) 01/27/2022   K 4.5 01/27/2022   CO2 26 01/27/2022   GLUCOSE 207 (H) 01/27/2022   BUN 15 01/27/2022   CREATININE 1.01 01/27/2022   CALCIUM 9.3 01/27/2022   EGFR 86 01/20/2021   GFRNONAA >60 01/27/2022   Lab Results  Component Value Date   CHOL 129 01/27/2022   HDL 59 01/27/2022   LDLCALC 65 01/27/2022   TRIG 27 01/27/2022   CHOLHDL 2.2 01/27/2022   Lab Results  Component Value Date   TSH 0.712 01/27/2022   Lab Results  Component Value Date    HGBA1C 6.9 (A) 06/06/2022   Lab Results  Component Value Date   WBC 5.7 01/27/2022   HGB 16.1 01/27/2022   HCT 44.8 01/27/2022   MCV 88.7 01/27/2022   PLT 166 01/27/2022   Lab Results  Component Value Date   ALT 28 01/27/2022   AST 25 01/27/2022   ALKPHOS 66 01/27/2022   BILITOT 1.0 01/27/2022   No results found for: "25OHVITD2", "25OHVITD3", "VD25OH"   Review of Systems  Constitutional:  Negative for appetite change, chills, diaphoresis, fatigue and unexpected weight change.  HENT:  Negative for hearing loss, tinnitus, trouble swallowing and voice change.   Eyes:  Negative for visual disturbance.  Respiratory:  Negative for choking, shortness of breath and wheezing.   Cardiovascular:  Negative for chest pain, palpitations and leg swelling.  Gastrointestinal:  Negative for abdominal pain, blood in stool, constipation and diarrhea.  Genitourinary:  Negative for difficulty urinating, dysuria and frequency.  Musculoskeletal:  Negative for arthralgias, back pain and myalgias.  Skin:  Negative for color change and rash.  Neurological:  Negative for dizziness, syncope and headaches.  Hematological:  Negative for adenopathy.  Psychiatric/Behavioral:  Negative for dysphoric mood and sleep disturbance. The patient is not nervous/anxious.     Patient Active Problem List   Diagnosis Date Noted   Type 1 diabetes mellitus with other specified complication (HCC) 09/02/2020   Carpal tunnel syndrome on both sides 04/10/2019   Hyperlipidemia LDL goal <100 09/26/2017   Thyroid nodule 2017   ED (erectile dysfunction) 03/13/2013  OSA on CPAP 11/22/2011    No Known Allergies  Past Surgical History:  Procedure Laterality Date   CYST REMOVAL TRUNK  2000    Social History   Tobacco Use   Smoking status: Never   Smokeless tobacco: Never  Vaping Use   Vaping status: Never Used  Substance Use Topics   Alcohol use: Yes    Comment: rarely   Drug use: No     Medication list has  been reviewed and updated.  Current Meds  Medication Sig   aspirin EC 81 MG tablet Take 81 mg by mouth daily.   Continuous Glucose Transmitter (DEXCOM G6 TRANSMITTER) MISC USE DAILY AS DIRECTED   insulin glargine (LANTUS SOLOSTAR) 100 UNIT/ML Solostar Pen ADMINISTER 50 UNITS UNDER THE SKIN AT BEDTIME   insulin lispro (HUMALOG KWIKPEN) 100 UNIT/ML KwikPen ADMINISTER 34 UNITS UNDER THE SKIN THREE TIMES DAILY   lisinopril (ZESTRIL) 5 MG tablet TAKE ONE (1) TABLET BY MOUTH ONCE DAILY   NON FORMULARY CPAP nightly @ 7 cm H2O   sildenafil (REVATIO) 20 MG tablet TAKE (1) TABLET BY MOUTH EVERY DAY AS NEEDED   [DISCONTINUED] atorvastatin (LIPITOR) 10 MG tablet TAKE (1) TABLET BY MOUTH EVERY DAY   [DISCONTINUED] Continuous Blood Gluc Sensor (DEXCOM G6 SENSOR) MISC Place 1 each onto the skin continuous.       02/01/2023    8:08 AM 10/05/2022    4:08 PM 06/06/2022    8:14 AM 06/06/2022    8:06 AM  GAD 7 : Generalized Anxiety Score  Nervous, Anxious, on Edge 0 0 0 0  Control/stop worrying 0 0 0 0  Worry too much - different things 0 0 0 0  Trouble relaxing 0 0 0 0  Restless 0 0 0 0  Easily annoyed or irritable 0 0 0 0  Afraid - awful might happen 0 0 0 0  Total GAD 7 Score 0 0 0 0  Anxiety Difficulty Not difficult at all Not difficult at all Not difficult at all Not difficult at all       02/01/2023    8:08 AM 10/05/2022    4:08 PM 06/06/2022    8:13 AM  Depression screen PHQ 2/9  Decreased Interest 0 0 0  Down, Depressed, Hopeless 0 0 0  PHQ - 2 Score 0 0 0  Altered sleeping 0 0 0  Tired, decreased energy 0 0 0  Change in appetite 0 0 0  Feeling bad or failure about yourself  0 0 0  Trouble concentrating 0 0 0  Moving slowly or fidgety/restless 0 0 0  Suicidal thoughts 0 0 0  PHQ-9 Score 0 0 0  Difficult doing work/chores Not difficult at all Not difficult at all Not difficult at all    BP Readings from Last 3 Encounters:  02/01/23 126/78  10/05/22 112/62  06/06/22 112/78     Physical Exam Vitals and nursing note reviewed.  Constitutional:      Appearance: Normal appearance. He is well-developed.  HENT:     Head: Normocephalic.     Right Ear: Tympanic membrane, ear canal and external ear normal.     Left Ear: Tympanic membrane, ear canal and external ear normal.     Nose: Nose normal.  Eyes:     Conjunctiva/sclera: Conjunctivae normal.     Pupils: Pupils are equal, round, and reactive to light.  Neck:     Thyroid: No thyromegaly.     Vascular: No carotid bruit.  Cardiovascular:  Rate and Rhythm: Normal rate and regular rhythm.     Heart sounds: Normal heart sounds.  Pulmonary:     Effort: Pulmonary effort is normal.     Breath sounds: Normal breath sounds. No wheezing.  Chest:  Breasts:    Right: No mass.     Left: No mass.  Abdominal:     General: Bowel sounds are normal.     Palpations: Abdomen is soft.     Tenderness: There is no abdominal tenderness.  Musculoskeletal:        General: Normal range of motion.     Cervical back: Normal range of motion and neck supple.     Right lower leg: No edema.     Left lower leg: No edema.  Lymphadenopathy:     Cervical: No cervical adenopathy.  Skin:    General: Skin is warm and dry.     Capillary Refill: Capillary refill takes less than 2 seconds.  Neurological:     General: No focal deficit present.     Mental Status: He is alert and oriented to person, place, and time.     Deep Tendon Reflexes: Reflexes are normal and symmetric.  Psychiatric:        Attention and Perception: Attention normal.        Mood and Affect: Mood normal.        Thought Content: Thought content normal.    Diabetic Foot Exam - Simple   Simple Foot Form Diabetic Foot exam was performed with the following findings: Yes 02/01/2023  8:20 AM  Visual Inspection No deformities, no ulcerations, no other skin breakdown bilaterally: Yes Sensation Testing Intact to touch and monofilament testing bilaterally: Yes Pulse  Check Posterior Tibialis and Dorsalis pulse intact bilaterally: Yes Comments      Wt Readings from Last 3 Encounters:  02/01/23 262 lb (118.8 kg)  10/05/22 260 lb (117.9 kg)  06/06/22 265 lb (120.2 kg)    BP 126/78   Pulse 90   Ht 6\' 1"  (1.854 m)   Wt 262 lb (118.8 kg)   SpO2 98%   BMI 34.57 kg/m waist 40 inches  Assessment and Plan:  Problem List Items Addressed This Visit       Unprioritized   Hyperlipidemia LDL goal <100 (Chronic)    LDL is  Lab Results  Component Value Date   LDLCALC 65 01/27/2022   Currently being treated with atorvastatin with good compliance and no concerns.       Relevant Medications   atorvastatin (LIPITOR) 10 MG tablet   Other Relevant Orders   CBC with Differential/Platelet   Lipid panel   Thyroid nodule (Chronic)    No current thyroid related symptoms Lab Results  Component Value Date   TSH 0.712 01/27/2022         Relevant Orders   TSH   Type 1 diabetes mellitus with other specified complication (HCC) (Chronic)    BS are stable, A1C slightly higher than desired. Continue current insulin regimen.       Relevant Medications   atorvastatin (LIPITOR) 10 MG tablet   Other Relevant Orders   Comprehensive metabolic panel   Hemoglobin A1c   Microalbumin / creatinine urine ratio   Other Visit Diagnoses     Annual physical exam    -  Primary   Relevant Orders   CBC with Differential/Platelet   Comprehensive metabolic panel   Hemoglobin A1c   Lipid panel   PSA   Microalbumin / creatinine urine  ratio   Prostate cancer screening       Relevant Orders   PSA   Type 1 diabetes mellitus with hyperglycemia (HCC)       Relevant Medications   atorvastatin (LIPITOR) 10 MG tablet   Continuous Glucose Sensor (DEXCOM G6 SENSOR) MISC   Colon cancer screening       Relevant Orders   Ambulatory referral to Gastroenterology   Need for influenza vaccination       Relevant Orders   Flu vaccine trivalent PF, 6mos and  older(Flulaval,Afluria,Fluarix,Fluzone) (Completed)   Long-term insulin use (HCC)          He will have a CPX form for insurance premium reduction that he will forward when it is available to him.  Return in about 4 months (around 06/04/2023) for DM.    Reubin Milan, MD [facility] Primary Care and Sports Medicine [place]

## 2023-02-01 NOTE — Addendum Note (Signed)
Addended by: Mariel Sleet on: 02/01/2023 08:33 AM   Modules accepted: Orders

## 2023-02-01 NOTE — Assessment & Plan Note (Signed)
BS are stable, A1C slightly higher than desired. Continue current insulin regimen.

## 2023-02-01 NOTE — Assessment & Plan Note (Signed)
LDL is  Lab Results  Component Value Date   LDLCALC 65 01/27/2022   Currently being treated with atorvastatin with good compliance and no concerns.

## 2023-02-01 NOTE — Assessment & Plan Note (Signed)
No current thyroid related symptoms Lab Results  Component Value Date   TSH 0.712 01/27/2022

## 2023-02-02 LAB — MICROALBUMIN / CREATININE URINE RATIO
Creatinine, Urine: 57.3 mg/dL
Microalb Creat Ratio: <5 mg/g{creat} (ref 0–29)
Microalb, Ur: <3 ug/mL — ABNORMAL HIGH

## 2023-02-06 ENCOUNTER — Telehealth: Payer: Self-pay

## 2023-02-06 NOTE — Telephone Encounter (Signed)
PT requesting call back schedule colonoscopy

## 2023-02-06 NOTE — Telephone Encounter (Signed)
Returned phone call to patient to schedule colonoscopy.  LVM for pt to return my call.   Thanks, Raoul, [place]

## 2023-02-14 ENCOUNTER — Telehealth: Payer: Self-pay

## 2023-02-14 ENCOUNTER — Encounter: Payer: Self-pay | Admitting: *Deleted

## 2023-02-14 NOTE — Telephone Encounter (Signed)
Patient called in and left a voicemail stating that he want to schedule his procedure. I called him back to confirm we got his voicemail and there was no answer I left a message for him to call back.

## 2023-02-14 NOTE — Telephone Encounter (Signed)
Patient called in Again and left a voicemail stating that he wants to schedule his procedure. I called him back Again to confirm we got his voicemail. I gave the patient Johny Drilling number and transfer him.

## 2023-02-15 ENCOUNTER — Other Ambulatory Visit: Payer: Self-pay | Admitting: *Deleted

## 2023-02-15 ENCOUNTER — Telehealth: Payer: Self-pay | Admitting: *Deleted

## 2023-02-15 DIAGNOSIS — Z1211 Encounter for screening for malignant neoplasm of colon: Secondary | ICD-10-CM

## 2023-02-15 MED ORDER — NA SULFATE-K SULFATE-MG SULF 17.5-3.13-1.6 GM/177ML PO SOLN: 1.0000 | Freq: Once | ORAL | 0 refills | Status: AC

## 2023-02-15 NOTE — Telephone Encounter (Signed)
Gastroenterology Pre-Procedure Review  Request Date: 03/20/2023 Requesting Physician: Dr. Servando Snare   PATIENT REVIEW QUESTIONS: The patient responded to the following health history questions as indicated:    1. Are you having any GI issues? no 2. Do you have a personal history of Polyps? no 3. Do you have a family history of Colon Cancer or Polyps? yes (uncle on father side had colon cancer) 4. Diabetes Mellitus? yes (taking Lantus and Humalog) 5. Joint replacements in the past 12 months?no 6. Major health problems in the past 3 months?no 7. Any artificial heart valves, MVP, or defibrillator?no    MEDICATIONS & ALLERGIES:    Patient reports the following regarding taking any anticoagulation/antiplatelet therapy:   Plavix, Coumadin, Eliquis, Xarelto, Lovenox, Pradaxa, Brilinta, or Effient? no Aspirin? yes (81 mg)  Patient confirms/reports the following medications:  Current Outpatient Medications  Medication Sig Dispense Refill   aspirin EC 81 MG tablet Take 81 mg by mouth daily.     atorvastatin (LIPITOR) 10 MG tablet TAKE (1) TABLET BY MOUTH EVERY DAY 90 tablet 3   Continuous Glucose Sensor (DEXCOM G6 SENSOR) MISC Place 1 each onto the skin continuous. 9 each 3   Continuous Glucose Transmitter (DEXCOM G6 TRANSMITTER) MISC USE DAILY AS DIRECTED 1 each 1   insulin glargine (LANTUS SOLOSTAR) 100 UNIT/ML Solostar Pen ADMINISTER 50 UNITS UNDER THE SKIN AT BEDTIME 15 mL 3   insulin lispro (HUMALOG KWIKPEN) 100 UNIT/ML KwikPen ADMINISTER 34 UNITS UNDER THE SKIN THREE TIMES DAILY 90 mL 3   lisinopril (ZESTRIL) 5 MG tablet TAKE ONE (1) TABLET BY MOUTH ONCE DAILY 90 tablet 0   NON FORMULARY CPAP nightly @ 7 cm H2O     sildenafil (REVATIO) 20 MG tablet TAKE (1) TABLET BY MOUTH EVERY DAY AS NEEDED 30 tablet 1   No current facility-administered medications for this visit.    Patient confirms/reports the following allergies:  No Known Allergies  No orders of the defined types were placed in  this encounter.   AUTHORIZATION INFORMATION Primary Insurance: 1D#: Group #:  Secondary Insurance: 1D#: Group #:  SCHEDULE INFORMATION: Date: 03/20/2023 Time: Location:  [facility]

## 2023-02-15 NOTE — Telephone Encounter (Signed)
Colonoscopy schedule with Dr Servando Snare on 03/20/2023 at [facility]

## 2023-02-15 NOTE — Telephone Encounter (Signed)
Message left for patient to return my call.

## 2023-03-09 ENCOUNTER — Encounter: Payer: Self-pay | Admitting: Gastroenterology

## 2023-03-11 ENCOUNTER — Encounter: Payer: Self-pay | Admitting: Gastroenterology

## 2023-03-11 NOTE — Anesthesia Preprocedure Evaluation (Addendum)
Anesthesia Evaluation  Patient identified by MRN, date of birth, ID band Patient awake    Reviewed: Allergy & Precautions, H&P , NPO status , Patient's Chart, lab work & pertinent test results  History of Anesthesia Complications (+) history of anesthetic complications  Airway Mallampati: III  TM Distance: >3 FB Neck ROM: Full    Dental no notable dental hx.    Pulmonary neg pulmonary ROS, sleep apnea    Pulmonary exam normal breath sounds clear to auscultation       Cardiovascular negative cardio ROS Normal cardiovascular exam Rhythm:Regular Rate:Normal     Neuro/Psych  Neuromuscular disease negative neurological ROS  negative psych ROS   GI/Hepatic negative GI ROS, Neg liver ROS,,,  Endo/Other  negative endocrine ROSdiabetes    Renal/GU negative Renal ROS  negative genitourinary   Musculoskeletal negative musculoskeletal ROS (+)    Abdominal   Peds negative pediatric ROS (+)  Hematology negative hematology ROS (+)   Anesthesia Other Findings Patient woke up during pilonidal cyst procedure, was not in pain, had spinal anesthesia, but woke up "with anesthesiologist reading the newspaper." I explained to patient that one can awaken during sedation, but that will not happen today, and none of anesthesia people today will be reading the newspaper while caring for him!    Type I diabetes mellitus Wears contact lens Sleep apnea on CPAP  Reproductive/Obstetrics negative OB ROS                              Anesthesia Physical Anesthesia Plan  ASA: 2  Anesthesia Plan: General   Post-op Pain Management:    Induction: Intravenous  PONV Risk Score and Plan:   Airway Management Planned: Natural Airway and Nasal Cannula  Additional Equipment:   Intra-op Plan:   Post-operative Plan:   Informed Consent: I have reviewed the patients History and Physical, chart, labs and discussed the  procedure including the risks, benefits and alternatives for the proposed anesthesia with the patient or authorized representative who has indicated his/her understanding and acceptance.     Dental Advisory Given  Plan Discussed with: Anesthesiologist, CRNA and Surgeon  Anesthesia Plan Comments: (Patient consented for risks of anesthesia including but not limited to:  - adverse reactions to medications - risk of airway placement if required - damage to eyes, teeth, lips or other oral mucosa - nerve damage due to positioning  - sore throat or hoarseness - Damage to heart, brain, nerves, lungs, other parts of body or loss of life  Patient voiced understanding and assent.)         Anesthesia Quick Evaluation

## 2023-03-20 ENCOUNTER — Other Ambulatory Visit: Payer: Self-pay

## 2023-03-20 ENCOUNTER — Encounter
Admission: RE | Disposition: A | Discharge status: Home / Self Care | Payer: Self-pay | Source: Home / Self Care | Attending: Gastroenterology

## 2023-03-20 ENCOUNTER — Ambulatory Visit: Payer: BC Managed Care – PPO | Admitting: Anesthesiology

## 2023-03-20 ENCOUNTER — Ambulatory Visit
Admission: RE | Admit: 2023-03-20 | Discharge: 2023-03-20 | Disposition: A | Discharge status: Home / Self Care | Payer: BC Managed Care – PPO | Attending: Gastroenterology | Admitting: Gastroenterology

## 2023-03-20 ENCOUNTER — Encounter: Payer: Self-pay | Admitting: Gastroenterology

## 2023-03-20 DIAGNOSIS — G4733 Obstructive sleep apnea (adult) (pediatric): Secondary | ICD-10-CM | POA: Insufficient documentation

## 2023-03-20 DIAGNOSIS — Z1211 Encounter for screening for malignant neoplasm of colon: Secondary | ICD-10-CM | POA: Diagnosis not present

## 2023-03-20 DIAGNOSIS — K635 Polyp of colon: Secondary | ICD-10-CM | POA: Insufficient documentation

## 2023-03-20 DIAGNOSIS — E109 Type 1 diabetes mellitus without complications: Secondary | ICD-10-CM | POA: Insufficient documentation

## 2023-03-20 DIAGNOSIS — K640 First degree hemorrhoids: Secondary | ICD-10-CM | POA: Diagnosis not present

## 2023-03-20 DIAGNOSIS — E785 Hyperlipidemia, unspecified: Secondary | ICD-10-CM | POA: Diagnosis not present

## 2023-03-20 HISTORY — DX: Sleep apnea, unspecified: G47.30

## 2023-03-20 HISTORY — DX: Other complications of anesthesia, initial encounter: T88.59XA

## 2023-03-20 HISTORY — DX: Obstructive sleep apnea (adult) (pediatric): G47.33

## 2023-03-20 HISTORY — PX: COLONOSCOPY WITH PROPOFOL: SHX5780

## 2023-03-20 HISTORY — DX: Presence of spectacles and contact lenses: Z97.3

## 2023-03-20 LAB — GLUCOSE, CAPILLARY: Glucose-Capillary: 231 mg/dL — ABNORMAL HIGH (ref 70–99)

## 2023-03-20 SURGERY — COLONOSCOPY WITH PROPOFOL
Anesthesia: General

## 2023-03-20 MED ORDER — LIDOCAINE HCL (CARDIAC) PF 100 MG/5ML IV SOSY
PREFILLED_SYRINGE | INTRAVENOUS | Status: DC | PRN
  Administered 2023-03-20: 60 mg via INTRAVENOUS

## 2023-03-20 MED ORDER — PROPOFOL 500 MG/50ML IV EMUL
INTRAVENOUS | Status: DC | PRN
  Administered 2023-03-20: 180 ug/kg/min via INTRAVENOUS

## 2023-03-20 MED ORDER — STERILE WATER FOR IRRIGATION IR SOLN: Status: DC | PRN

## 2023-03-20 MED ORDER — LACTATED RINGERS IV SOLN: INTRAVENOUS | Status: DC | PRN

## 2023-03-20 MED ORDER — LACTATED RINGERS IV SOLN: Freq: Once | INTRAVENOUS | Status: AC

## 2023-03-20 MED ORDER — PROPOFOL 10 MG/ML IV BOLUS
INTRAVENOUS | Status: DC | PRN
  Administered 2023-03-20: 80 mg via INTRAVENOUS
  Administered 2023-03-20: 30 mg via INTRAVENOUS

## 2023-03-20 SURGICAL SUPPLY — 8 items
GOWN CVR UNV OPN BCK APRN NK (MISCELLANEOUS) ×2 IMPLANT
GOWN ISOL THUMB LOOP REG UNIV (MISCELLANEOUS) ×2
KIT PRC NS LF DISP ENDO (KITS) ×1 IMPLANT
KIT PROCEDURE OLYMPUS (KITS) ×1
MANIFOLD NEPTUNE II (INSTRUMENTS) ×1 IMPLANT
SNARE COLD EXACTO (MISCELLANEOUS) IMPLANT
TRAP ETRAP POLY (MISCELLANEOUS) IMPLANT
WATER STERILE IRR 250ML POUR (IV SOLUTION) ×1 IMPLANT

## 2023-03-20 NOTE — Op Note (Signed)
[facility] Gastroenterology Patient Name: Darius Morrison Procedure Date: 03/20/2023 8:07 AM MRN: [id number] Account #: [id number] Date of Birth: 02-13-1976 Admit Type: Outpatient Age: 47 Room: [facility] OR ROOM 01 Gender: Male Note Status: Finalized Instrument Name: [id number] Procedure:             Colonoscopy Indications:           Screening for colorectal malignant neoplasm Providers:             [name] MD, MD Referring MD:          Bari Edward, MD (Referring MD) Medicines:             Propofol per Anesthesia Complications:         No immediate complications. Procedure:             Pre-Anesthesia Assessment:                        - Prior to the procedure, a History and Physical was                         performed, and patient medications and allergies were                         reviewed. The patient's tolerance of previous                         anesthesia was also reviewed. The risks and benefits                         of the procedure and the sedation options and risks                         were discussed with the patient. All questions were                         answered, and informed consent was obtained. Prior                         Anticoagulants: The patient has taken no anticoagulant                         or antiplatelet agents. ASA Grade Assessment: II - A                         patient with mild systemic disease. After reviewing                         the risks and benefits, the patient was deemed in                         satisfactory condition to undergo the procedure.                        After obtaining informed consent, the colonoscope was                         passed under direct vision. Throughout the procedure,  the patient's blood pressure, pulse, and oxygen                         saturations were monitored continuously. The                         Colonoscope was introduced through the anus and                          advanced to the the cecum, identified by appendiceal                         orifice and ileocecal valve. The colonoscopy was                         performed without difficulty. The patient tolerated                         the procedure well. The quality of the bowel                         preparation was excellent. Findings:      The perianal and digital rectal examinations were normal.      A 4 mm polyp was found in the sigmoid colon. The polyp was sessile. The       polyp was removed with a cold snare. Resection and retrieval were       complete.      Non-bleeding internal hemorrhoids were found during retroflexion. The       hemorrhoids were Grade I (internal hemorrhoids that do not prolapse). Impression:            - One 4 mm polyp in the sigmoid colon, removed with a                         cold snare. Resected and retrieved.                        - Non-bleeding internal hemorrhoids. Recommendation:        - Discharge patient to home.                        - Resume previous diet.                        - Continue present medications.                        - Await pathology results.                        - If the pathology report reveals adenomatous tissue,                         then repeat the colonoscopy for surveillance in 7                         years. Procedure Code(s):     --- Professional ---  81191, Colonoscopy, flexible; with removal of                         tumor(s), polyp(s), or other lesion(s) by snare                         technique Diagnosis Code(s):     --- Professional ---                        Z12.11, Encounter for screening for malignant neoplasm                         of colon                        D12.5, Benign neoplasm of sigmoid colon CPT copyright 2022 American Medical Association. All rights reserved. The codes documented in this report are preliminary and upon coder review may  be revised  to meet current compliance requirements. [name] MD, MD 03/20/2023 8:28:56 AM This report has been signed electronically. Number of Addenda: 0 Note Initiated On: 03/20/2023 8:07 AM Scope Withdrawal Time: 0 hours 9 minutes 38 seconds  Total Procedure Duration: 0 hours 12 minutes 0 seconds  Estimated Blood Loss:  Estimated blood loss: none.      [facility]

## 2023-03-20 NOTE — Transfer of Care (Signed)
Immediate Anesthesia Transfer of Care Note  Patient: Darius Morrison  Procedure(s) Performed: COLONOSCOPY WITH PROPOFOL WITH POLYPECTOMY  Patient Location: PACU  Anesthesia Type:General  Level of Consciousness: awake, alert , and oriented  Airway & Oxygen Therapy: Patient Spontanous Breathing  Post-op Assessment: Report given to RN and Post -op Vital signs reviewed and stable  Post vital signs: Reviewed and stable  Last Vitals:  Vitals Value Taken Time  BP 104/80 03/20/23 0830  Temp    Pulse 81 03/20/23 0831  Resp 12 03/20/23 0831  SpO2 97 % 03/20/23 0831  Vitals shown include unfiled device data.  Last Pain:  Vitals:   03/20/23 0737  TempSrc: Temporal  PainSc: 2          Complications: No notable events documented.

## 2023-03-20 NOTE — Anesthesia Postprocedure Evaluation (Signed)
Anesthesia Post Note  Patient: Darius Morrison  Procedure(s) Performed: COLONOSCOPY WITH PROPOFOL WITH POLYPECTOMY  Patient location during evaluation: PACU Anesthesia Type: General Level of consciousness: awake and alert Pain management: pain level controlled Vital Signs Assessment: post-procedure vital signs reviewed and stable Respiratory status: spontaneous breathing, nonlabored ventilation, respiratory function stable and patient connected to nasal cannula oxygen Cardiovascular status: blood pressure returned to baseline and stable Postop Assessment: no apparent nausea or vomiting Anesthetic complications: no   No notable events documented.   Last Vitals:  Vitals:   03/20/23 0847 03/20/23 0848  BP:  115/75  Pulse: 79 91  Resp: 17 17  Temp:    SpO2: 99% 96%    Last Pain:  Vitals:   03/20/23 0830  TempSrc: Temporal  PainSc:                  [name]

## 2023-03-20 NOTE — H&P (Signed)
Darius Minium, MD [facility] [address], [address] [place], [place] [address] Phone: [phone] Fax : [phone]  Primary Care Physician:  Darius Milan, MD Primary Gastroenterologist:  Dr. Servando Snare  Pre-Procedure History & Physical: HPI:  Darius Morrison is a 47 y.o. male is here for a screening colonoscopy.   Past Medical History:  Diagnosis Date   Complication of anesthesia    Woke during pilonidal cyst procedure   Diabetes type 1, controlled (HCC)    OSA on CPAP    Sleep apnea    CPAP   Wears contact lenses     Past Surgical History:  Procedure Laterality Date   CYST REMOVAL TRUNK  2000    Prior to Admission medications   Medication Sig Start Date End Date Taking? Authorizing Provider  aspirin EC 81 MG tablet Take 81 mg by mouth daily.   Yes [provider]  atorvastatin (LIPITOR) 10 MG tablet TAKE (1) TABLET BY MOUTH EVERY DAY 02/01/23  Yes Darius Milan, MD  insulin glargine (LANTUS SOLOSTAR) 100 UNIT/ML Solostar Pen ADMINISTER 50 UNITS UNDER THE SKIN AT BEDTIME Patient taking differently: 30 Units at bedtime. 01/29/23  Yes Darius Milan, MD  insulin lispro (HUMALOG KWIKPEN) 100 UNIT/ML KwikPen ADMINISTER 34 UNITS UNDER THE SKIN THREE TIMES DAILY Patient taking differently: 16-20 Units 3 (three) times daily. 01/29/23  Yes Darius Milan, MD  lisinopril (ZESTRIL) 5 MG tablet TAKE ONE (1) TABLET BY MOUTH ONCE DAILY 01/03/23  Yes Darius Milan, MD  NON FORMULARY CPAP nightly @ 7 cm H2O   Yes [provider]  sildenafil (REVATIO) 20 MG tablet TAKE (1) TABLET BY MOUTH EVERY DAY AS NEEDED 10/30/22  Yes Darius Milan, MD  Continuous Glucose Sensor (DEXCOM G6 SENSOR) MISC Place 1 each onto the skin continuous. 02/01/23   Darius Milan, MD  Continuous Glucose Transmitter (DEXCOM G6 TRANSMITTER) MISC USE DAILY AS DIRECTED 09/21/22   Darius Milan, MD    Allergies as of 02/15/2023   (No Known Allergies)    Family History  Problem  Relation Age of Onset   Thyroid disease Mother    Colon cancer Paternal Uncle    Diabetes Maternal Grandmother    Heart attack Maternal Grandfather    Heart disease Maternal Grandfather    Dementia Paternal Grandmother    Dementia Paternal Grandfather    Heart attack Paternal Grandfather    Heart disease Paternal Grandfather     Social History   Socioeconomic History   Marital status: Married    Spouse name: Not on file   Number of children: 1   Years of education: Not on file   Highest education level: Bachelor's degree (e.g., BA, AB, BS)  Occupational History   Not on file  Tobacco Use   Smoking status: Never   Smokeless tobacco: Never  Vaping Use   Vaping status: Never Used  Substance and Sexual Activity   Alcohol use: Yes    Comment: rarely   Drug use: No   Sexual activity: Yes  Other Topics Concern   Not on file  Social History Narrative   Not on file   Social Determinants of Health   Financial Resource Strain: Patient Declined (10/03/2022)   Overall Financial Resource Strain (CARDIA)    Difficulty of Paying Living Expenses: Patient declined  Food Insecurity: Patient Declined (10/03/2022)   Hunger Vital Sign    Worried About Running Out of Food in the Last Year: Patient declined  Ran Out of Food in the Last Year: Patient declined  Transportation Needs: Patient Declined (10/03/2022)   PRAPARE - Administrator, Civil Service (Medical): Patient declined    Lack of Transportation (Non-Medical): Patient declined  Physical Activity: Sufficiently Active (10/03/2022)   Exercise Vital Sign    Days of Exercise per Week: 5 days    Minutes of Exercise per Session: 30 min  Stress: Patient Declined (10/03/2022)   [employer] of Occupational Health - Occupational Stress Questionnaire    Feeling of Stress : Patient declined  Social Connections: Unknown (10/03/2022)   Social Connection and Isolation Panel [NHANES]    Frequency of Communication with Friends and  Family: Patient declined    Frequency of Social Gatherings with Friends and Family: Patient declined    Attends Religious Services: Patient declined    Database administrator or Organizations: Patient declined    Attends Banker Meetings: Not on file    Marital Status: Married  Intimate Partner Violence: Not At Risk (06/06/2022)   Humiliation, Afraid, Rape, and Kick questionnaire    Fear of Current or Ex-Partner: No    Emotionally Abused: No    Physically Abused: No    Sexually Abused: No    Review of Systems: See HPI, otherwise negative ROS  Physical Exam: BP (!) 137/93   Pulse 73   Temp 97.8 F (36.6 C) (Temporal)   Resp 18   Ht 6\' 1"  (1.854 m)   Wt 114.8 kg   SpO2 98%   BMI 33.38 kg/m  General:   Alert,  pleasant and cooperative in NAD Head:  Normocephalic and atraumatic. Neck:  Supple; no masses or thyromegaly. Lungs:  Clear throughout to auscultation.    Heart:  Regular rate and rhythm. Abdomen:  Soft, nontender and nondistended. Normal bowel sounds, without guarding, and without rebound.   Neurologic:  Alert and  oriented x4;  grossly normal neurologically.  Impression/Plan: Darius Morrison is now here to undergo a screening colonoscopy.  Risks, benefits, and alternatives regarding colonoscopy have been reviewed with the patient.  Questions have been answered.  All parties agreeable.

## 2023-03-21 ENCOUNTER — Encounter: Payer: Self-pay | Admitting: Gastroenterology

## 2023-03-21 LAB — SURGICAL PATHOLOGY

## 2023-03-22 ENCOUNTER — Encounter: Payer: Self-pay | Admitting: Gastroenterology

## 2023-04-19 ENCOUNTER — Other Ambulatory Visit: Payer: Self-pay

## 2023-04-19 ENCOUNTER — Encounter: Payer: Self-pay | Admitting: Internal Medicine

## 2023-04-19 ENCOUNTER — Other Ambulatory Visit: Payer: Self-pay | Admitting: Internal Medicine

## 2023-04-19 DIAGNOSIS — E1069 Type 1 diabetes mellitus with other specified complication: Secondary | ICD-10-CM

## 2023-04-19 MED ORDER — DEXCOM G7 SENSOR MISC: 1.0000 | 1 refills | Status: DC

## 2023-04-19 MED ORDER — LISINOPRIL 5 MG PO TABS: ORAL_TABLET | ORAL | 0 refills | Status: DC

## 2023-05-13 ENCOUNTER — Other Ambulatory Visit: Payer: Self-pay | Admitting: Internal Medicine

## 2023-05-13 DIAGNOSIS — N529 Male erectile dysfunction, unspecified: Secondary | ICD-10-CM

## 2023-05-16 NOTE — Telephone Encounter (Signed)
 Requested Prescriptions  Pending Prescriptions Disp Refills   sildenafil  (REVATIO ) 20 MG tablet [Pharmacy Med Name: SILDENAFIL  CITRATE 20MG  TABLET] 30 tablet 1    Sig: TAKE (1) TABLET BY MOUTH EVERY DAY AS NEEDED     Urology: Erectile Dysfunction Agents Passed - 05/16/2023  8:46 AM      Passed - AST in normal range and within 360 days    AST  Date Value Ref Range Status  02/01/2023 26 15 - 41 U/L Final         Passed - ALT in normal range and within 360 days    ALT  Date Value Ref Range Status  02/01/2023 28 0 - 44 U/L Final         Passed - Last BP in normal range    BP Readings from Last 1 Encounters:  03/20/23 115/75         Passed - Valid encounter within last 12 months    Recent Outpatient Visits           3 months ago Annual physical exam   [place] [facility] at [facility], Leita DEL, MD   7 months ago Type 1 diabetes mellitus with other specified complication [facility])   [place] [facility] at [facility], Leita DEL, MD   11 months ago Type 1 diabetes mellitus with other specified complication [facility])   [place] [facility] at [facility], Leita DEL, MD   1 year ago Annual physical exam   [facility] at [facility], Leita DEL, MD   1 year ago Type 1 diabetes mellitus with other specified complication [facility])   [place] [facility] at [facility], Leita DEL, MD       Future Appointments             In 1 month Justus, Leita DEL, MD [facility] at [facility], [facility]

## 2023-05-25 ENCOUNTER — Other Ambulatory Visit: Payer: Self-pay | Admitting: Internal Medicine

## 2023-05-25 DIAGNOSIS — E1069 Type 1 diabetes mellitus with other specified complication: Secondary | ICD-10-CM

## 2023-05-25 NOTE — Telephone Encounter (Signed)
Reordered 01/29/23 15 ml 3 RF Requested Prescriptions  Refused Prescriptions Disp Refills   LANTUS SOLOSTAR 100 UNIT/ML Solostar Pen [Pharmacy Med Name: LANTUS SOLOSTAR PEN INJ 3ML] 15 mL 3    Sig: ADMINISTER 50 UNITS UNDER THE SKIN AT BEDTIME     Endocrinology:  Diabetes - Insulins Passed - 05/25/2023 11:02 AM      Passed - HBA1C is between 0 and 7.9 and within 180 days    Hemoglobin A1C  Date Value Ref Range Status  10/10/2018 7.2  Final   Hgb A1c MFr Bld  Date Value Ref Range Status  02/01/2023 7.1 (H) 4.8 - 5.6 % Final    Comment:    (NOTE) Pre diabetes:          5.7%-6.4%  Diabetes:              >6.4%  Glycemic control for   <7.0% adults with diabetes          Passed - Valid encounter within last 6 months    Recent Outpatient Visits           3 months ago Annual physical exam   [place] [facility] at [facility], Nyoka Cowden, MD   7 months ago Type 1 diabetes mellitus with other specified complication [facility])   [place] [facility] at [facility], Nyoka Cowden, MD   11 months ago Type 1 diabetes mellitus with other specified complication [facility])   [place] [facility] at [facility], Nyoka Cowden, MD   1 year ago Annual physical exam   [facility] at [facility], Nyoka Cowden, MD   1 year ago Type 1 diabetes mellitus with other specified complication [facility])   [place] [facility] at [facility], Nyoka Cowden, MD       Future Appointments             In 1 month Judithann Graves, Nyoka Cowden, MD [facility] at [facility], [facility]

## 2023-06-26 ENCOUNTER — Encounter: Payer: Self-pay | Admitting: Internal Medicine

## 2023-06-26 ENCOUNTER — Ambulatory Visit: Payer: BC Managed Care – PPO | Admitting: Internal Medicine

## 2023-06-26 VITALS — BP 116/78 | HR 96 | Ht 73.0 in | Wt 262.0 lb

## 2023-06-26 DIAGNOSIS — Z794 Long term (current) use of insulin: Secondary | ICD-10-CM | POA: Diagnosis not present

## 2023-06-26 DIAGNOSIS — E1069 Type 1 diabetes mellitus with other specified complication: Secondary | ICD-10-CM | POA: Diagnosis not present

## 2023-06-26 DIAGNOSIS — M79671 Pain in right foot: Secondary | ICD-10-CM

## 2023-06-26 LAB — POCT GLYCOSYLATED HEMOGLOBIN (HGB A1C): Hemoglobin A1C: 6.7 % — AB (ref 4.0–5.6)

## 2023-06-26 MED ORDER — LISINOPRIL 5 MG PO TABS: ORAL_TABLET | ORAL | 3 refills | Status: AC

## 2023-06-26 NOTE — Progress Notes (Signed)
 Date:  06/26/2023   Name:  Darius Morrison   DOB:  1975-12-27   MRN:  [id number]   Chief Complaint: Diabetes  Diabetes He presents for his follow-up diabetic visit. He has type 1 diabetes mellitus. His disease course has been stable. Pertinent negatives for hypoglycemia include no headaches or tremors. Pertinent negatives for diabetes include no chest pain, no fatigue, no polydipsia and no polyuria. Current diabetic treatment includes intensive insulin program. He is compliant with treatment all of the time.    Review of Systems  Constitutional:  Negative for appetite change, fatigue and unexpected weight change.  Eyes:  Negative for visual disturbance.  Respiratory:  Negative for cough, shortness of breath and wheezing.   Cardiovascular:  Negative for chest pain, palpitations and leg swelling.  Gastrointestinal:  Negative for abdominal pain and blood in stool.       Feels that his digestion is slower - no n/v but having to take more novolog with meals now.  Endocrine: Negative for polydipsia and polyuria.  Genitourinary:  Negative for dysuria and hematuria.  Musculoskeletal:  Positive for arthralgias (right foot pain).  Skin:  Negative for color change and rash.  Neurological:  Negative for tremors, numbness and headaches.  Psychiatric/Behavioral:  Negative for dysphoric mood.      Lab Results  Component Value Date   NA 138 02/01/2023   K 4.6 02/01/2023   CO2 26 02/01/2023   GLUCOSE 111 (H) 02/01/2023   BUN 15 02/01/2023   CREATININE 0.93 02/01/2023   CALCIUM 9.2 02/01/2023   EGFR 86 01/20/2021   GFRNONAA >60 02/01/2023   Lab Results  Component Value Date   CHOL 139 02/01/2023   HDL 63 02/01/2023   LDLCALC 68 02/01/2023   TRIG 39 02/01/2023   CHOLHDL 2.2 02/01/2023   Lab Results  Component Value Date   TSH 0.812 02/01/2023   Lab Results  Component Value Date   HGBA1C 6.7 (A) 06/26/2023   Lab Results  Component Value Date   WBC 6.1 02/01/2023   HGB 15.8  02/01/2023   HCT 45.4 02/01/2023   MCV 92.1 02/01/2023   PLT 172 02/01/2023   Lab Results  Component Value Date   ALT 28 02/01/2023   AST 26 02/01/2023   ALKPHOS 61 02/01/2023   BILITOT 0.7 02/01/2023   No results found for: "25OHVITD2", "25OHVITD3", "VD25OH"   Patient Active Problem List   Diagnosis Date Noted   Special screening for malignant neoplasms, colon 03/20/2023   Polyp of sigmoid colon 03/20/2023   Type 1 diabetes mellitus with other specified complication (HCC) 09/02/2020   Carpal tunnel syndrome on both sides 04/10/2019   Hyperlipidemia LDL goal <100 09/26/2017   Thyroid nodule 2017   ED (erectile dysfunction) 03/13/2013   OSA on CPAP 11/22/2011    No Known Allergies  Past Surgical History:  Procedure Laterality Date   COLONOSCOPY WITH PROPOFOL N/A 03/20/2023   Procedure: COLONOSCOPY WITH PROPOFOL WITH POLYPECTOMY;  Surgeon: [name], MD;  Location[facility];  Service: Endoscopy;  Laterality: N/A;  Diabetic   CYST REMOVAL TRUNK  2000    Social History   Tobacco Use   Smoking status: Never   Smokeless tobacco: Never  Vaping Use   Vaping status: Never Used  Substance Use Topics   Alcohol use: Yes    Comment: rarely   Drug use: No     Medication list has been reviewed and updated.  Current Meds  Medication Sig  aspirin EC 81 MG tablet Take 81 mg by mouth daily.   atorvastatin (LIPITOR) 10 MG tablet TAKE (1) TABLET BY MOUTH EVERY DAY   Continuous Glucose Sensor (DEXCOM G7 SENSOR) MISC Place 1 each onto the skin continuous.   Continuous Glucose Transmitter (DEXCOM G6 TRANSMITTER) MISC USE DAILY AS DIRECTED   insulin glargine (LANTUS SOLOSTAR) 100 UNIT/ML Solostar Pen ADMINISTER 50 UNITS UNDER THE SKIN AT BEDTIME (Patient taking differently: 30 Units at bedtime.)   insulin lispro (HUMALOG KWIKPEN) 100 UNIT/ML KwikPen ADMINISTER 34 UNITS UNDER THE SKIN THREE TIMES DAILY (Patient taking differently: 16-20 Units 3 (three) times daily.)    NON FORMULARY CPAP nightly @ 7 cm H2O   sildenafil (REVATIO) 20 MG tablet TAKE (1) TABLET BY MOUTH EVERY DAY AS NEEDED   [DISCONTINUED] lisinopril (ZESTRIL) 5 MG tablet TAKE ONE (1) TABLET BY MOUTH ONCE DAILY       02/01/2023    8:08 AM 10/05/2022    4:08 PM 06/06/2022    8:14 AM 06/06/2022    8:06 AM  GAD 7 : Generalized Anxiety Score  Nervous, Anxious, on Edge 0 0 0 0  Control/stop worrying 0 0 0 0  Worry too much - different things 0 0 0 0  Trouble relaxing 0 0 0 0  Restless 0 0 0 0  Easily annoyed or irritable 0 0 0 0  Afraid - awful might happen 0 0 0 0  Total GAD 7 Score 0 0 0 0  Anxiety Difficulty Not difficult at all Not difficult at all Not difficult at all Not difficult at all       02/01/2023    8:08 AM 10/05/2022    4:08 PM 06/06/2022    8:13 AM  Depression screen PHQ 2/9  Decreased Interest 0 0 0  Down, Depressed, Hopeless 0 0 0  PHQ - 2 Score 0 0 0  Altered sleeping 0 0 0  Tired, decreased energy 0 0 0  Change in appetite 0 0 0  Feeling bad or failure about yourself  0 0 0  Trouble concentrating 0 0 0  Moving slowly or fidgety/restless 0 0 0  Suicidal thoughts 0 0 0  PHQ-9 Score 0 0 0  Difficult doing work/chores Not difficult at all Not difficult at all Not difficult at all    BP Readings from Last 3 Encounters:  06/26/23 116/78  03/20/23 115/75  02/01/23 126/78    Physical Exam Vitals and nursing note reviewed.  Constitutional:      General: He is not in acute distress.    Appearance: He is well-developed.  HENT:     Head: Normocephalic and atraumatic.  Cardiovascular:     Rate and Rhythm: Normal rate and regular rhythm.     Heart sounds: No murmur heard. Pulmonary:     Effort: Pulmonary effort is normal. No respiratory distress.     Breath sounds: No wheezing or rhonchi.  Musculoskeletal:     Cervical back: Normal range of motion.     Right lower leg: No edema.     Left lower leg: No edema.  Lymphadenopathy:     Cervical: No cervical adenopathy.   Skin:    General: Skin is warm and dry.     Findings: No rash.  Neurological:     General: No focal deficit present.     Mental Status: He is alert and oriented to person, place, and time.  Psychiatric:        Mood and Affect: Mood  normal.        Behavior: Behavior normal.     Wt Readings from Last 3 Encounters:  06/26/23 262 lb (118.8 kg)  03/20/23 253 lb (114.8 kg)  02/01/23 262 lb (118.8 kg)    BP 116/78   Pulse 96   Ht 6\' 1"  (1.854 m)   Wt 262 lb (118.8 kg)   SpO2 96%   BMI 34.57 kg/m   Assessment and Plan:  Problem List Items Addressed This Visit       Unprioritized   Type 1 diabetes mellitus with other specified complication (HCC) - Primary (Chronic)   Blood sugars stable without hypoglycemic symptoms or events. Currently managed with insulin injections.  He prefers not to use a pump. He has some symptoms to suggest gastroparesis - does not want medications at this time. Changes made last visit are none. Lab Results  Component Value Date   HGBA1C 7.1 (H) 02/01/2023  A1C today = 6.7       Relevant Medications   lisinopril (ZESTRIL) 5 MG tablet   Other Relevant Orders   POCT glycosylated hemoglobin (Hb A1C) (Completed)   Other Visit Diagnoses       Foot pain, right       suspect this is plantar fasciitis will refer to podiatry   Relevant Orders   Ambulatory referral to Podiatry     Long-term insulin use (HCC)           Return in about 4 months (around 10/24/2023) for DM.    Reubin Milan, MD [facility] [place]

## 2023-06-26 NOTE — Assessment & Plan Note (Addendum)
 Blood sugars stable without hypoglycemic symptoms or events. Currently managed with insulin injections.  He prefers not to use a pump. He has some symptoms to suggest gastroparesis - does not want medications at this time. Changes made last visit are none. Lab Results  Component Value Date   HGBA1C 7.1 (H) 02/01/2023  A1C today = 6.7

## 2023-07-17 ENCOUNTER — Encounter: Payer: Self-pay | Admitting: Internal Medicine

## 2023-07-17 DIAGNOSIS — M79671 Pain in right foot: Secondary | ICD-10-CM | POA: Diagnosis not present

## 2023-07-17 DIAGNOSIS — E109 Type 1 diabetes mellitus without complications: Secondary | ICD-10-CM | POA: Diagnosis not present

## 2023-07-17 DIAGNOSIS — M7671 Peroneal tendinitis, right leg: Secondary | ICD-10-CM | POA: Diagnosis not present

## 2023-07-20 NOTE — Telephone Encounter (Signed)
 Forms completed by PCP. FAX out on 07/20/2023.  IC

## 2023-08-14 DIAGNOSIS — E109 Type 1 diabetes mellitus without complications: Secondary | ICD-10-CM | POA: Diagnosis not present

## 2023-08-14 DIAGNOSIS — M7671 Peroneal tendinitis, right leg: Secondary | ICD-10-CM | POA: Diagnosis not present

## 2023-08-14 LAB — HM DIABETES FOOT EXAM

## 2023-09-14 ENCOUNTER — Other Ambulatory Visit: Payer: Self-pay | Admitting: Internal Medicine

## 2023-09-14 DIAGNOSIS — E1069 Type 1 diabetes mellitus with other specified complication: Secondary | ICD-10-CM

## 2023-09-15 ENCOUNTER — Other Ambulatory Visit: Payer: Self-pay | Admitting: Internal Medicine

## 2023-09-15 ENCOUNTER — Encounter: Payer: Self-pay | Admitting: Internal Medicine

## 2023-09-15 DIAGNOSIS — E1069 Type 1 diabetes mellitus with other specified complication: Secondary | ICD-10-CM

## 2023-09-15 MED ORDER — LANTUS SOLOSTAR 100 UNIT/ML ~~LOC~~ SOPN: 30.0000 [IU] | PEN_INJECTOR | Freq: Every day | SUBCUTANEOUS | 3 refills | Status: DC

## 2023-09-15 NOTE — Telephone Encounter (Signed)
 Please review.  KP

## 2023-09-20 ENCOUNTER — Other Ambulatory Visit: Payer: Self-pay | Admitting: Internal Medicine

## 2023-09-20 DIAGNOSIS — E1069 Type 1 diabetes mellitus with other specified complication: Secondary | ICD-10-CM

## 2023-09-20 MED ORDER — LANTUS SOLOSTAR 100 UNIT/ML ~~LOC~~ SOPN: 50.0000 [IU] | PEN_INJECTOR | Freq: Every day | SUBCUTANEOUS | 3 refills | Status: DC

## 2023-09-20 NOTE — Telephone Encounter (Signed)
Please review patient's message:

## 2023-09-21 ENCOUNTER — Other Ambulatory Visit: Payer: Self-pay

## 2023-09-21 DIAGNOSIS — E1069 Type 1 diabetes mellitus with other specified complication: Secondary | ICD-10-CM

## 2023-09-21 MED ORDER — LANTUS SOLOSTAR 100 UNIT/ML ~~LOC~~ SOPN: 50.0000 [IU] | PEN_INJECTOR | Freq: Every day | SUBCUTANEOUS | 3 refills | Status: AC

## 2023-10-30 ENCOUNTER — Encounter: Payer: Self-pay | Admitting: Internal Medicine

## 2023-10-30 ENCOUNTER — Other Ambulatory Visit
Admission: RE | Admit: 2023-10-30 | Discharge: 2023-10-30 | Disposition: A | Discharge status: Home / Self Care | Attending: Internal Medicine | Admitting: Internal Medicine

## 2023-10-30 ENCOUNTER — Ambulatory Visit: Payer: Self-pay | Admitting: Internal Medicine

## 2023-10-30 ENCOUNTER — Ambulatory Visit: Payer: BC Managed Care – PPO | Admitting: Internal Medicine

## 2023-10-30 VITALS — BP 124/70 | HR 85 | Ht 73.0 in | Wt 253.0 lb

## 2023-10-30 DIAGNOSIS — Z794 Long term (current) use of insulin: Secondary | ICD-10-CM | POA: Diagnosis not present

## 2023-10-30 DIAGNOSIS — E785 Hyperlipidemia, unspecified: Secondary | ICD-10-CM | POA: Diagnosis not present

## 2023-10-30 DIAGNOSIS — R002 Palpitations: Secondary | ICD-10-CM | POA: Diagnosis not present

## 2023-10-30 DIAGNOSIS — E1069 Type 1 diabetes mellitus with other specified complication: Secondary | ICD-10-CM

## 2023-10-30 LAB — COMPREHENSIVE METABOLIC PANEL WITH GFR
ALT: 21 U/L (ref 0–44)
AST: 21 U/L (ref 15–41)
Albumin: 4.1 g/dL (ref 3.5–5.0)
Alkaline Phosphatase: 61 U/L (ref 38–126)
Anion gap: 7 (ref 5–15)
BUN: 18 mg/dL (ref 6–20)
CO2: 27 mmol/L (ref 22–32)
Calcium: 9.7 mg/dL (ref 8.9–10.3)
Chloride: 101 mmol/L (ref 98–111)
Creatinine, Ser: 1.21 mg/dL (ref 0.61–1.24)
GFR, Estimated: >60 mL/min (ref >60)
Glucose, Bld: 82 mg/dL (ref 70–99)
Potassium: 4.7 mmol/L (ref 3.5–5.1)
Sodium: 135 mmol/L (ref 135–145)
Total Bilirubin: 0.6 mg/dL (ref 0.0–1.2)
Total Protein: 7.7 g/dL (ref 6.5–8.1)

## 2023-10-30 LAB — HEMOGLOBIN A1C
Hgb A1c MFr Bld: 6.3 % — ABNORMAL HIGH (ref 4.8–5.6)
Mean Plasma Glucose: 134.11 mg/dL

## 2023-10-30 MED ORDER — DEXCOM G7 SENSOR MISC: 1.0000 | 3 refills | Status: DC

## 2023-10-30 NOTE — Progress Notes (Signed)
 Date:  10/30/2023   Name:  Darius Morrison   DOB:  1975/11/21   MRN:  [id number]   Chief Complaint: Diabetes and Palpitations (Patient said he has had this in the past. Wants to check his potassium because last time this happened his potassium was very high.)  Diabetes He presents for his follow-up diabetic visit. He has type 1 diabetes mellitus. His disease course has been stable. Pertinent negatives for hypoglycemia include no headaches, nervousness/anxiousness or tremors. Pertinent negatives for diabetes include no chest pain, no fatigue, no polydipsia and no polyuria. Current diabetic treatment includes intensive insulin  program. He is compliant with treatment all of the time.  Palpitations  This is a recurrent problem. The problem occurs intermittently. Nothing aggravates the symptoms. Pertinent negatives include no anxiety, chest pain, coughing, numbness or shortness of breath.    Review of Systems  Constitutional:  Negative for appetite change, fatigue and unexpected weight change.  Eyes:  Negative for visual disturbance.  Respiratory:  Negative for cough, shortness of breath and wheezing.   Cardiovascular:  Positive for palpitations. Negative for chest pain and leg swelling.  Gastrointestinal:  Negative for abdominal pain and blood in stool.  Endocrine: Negative for polydipsia and polyuria.  Genitourinary:  Negative for dysuria and hematuria.  Skin:  Negative for color change and rash.  Neurological:  Negative for tremors, numbness and headaches.  Psychiatric/Behavioral:  Negative for dysphoric mood and sleep disturbance. The patient is not nervous/anxious.      Lab Results  Component Value Date   NA 138 02/01/2023   K 4.6 02/01/2023   CO2 26 02/01/2023   GLUCOSE 111 (H) 02/01/2023   BUN 15 02/01/2023   CREATININE 0.93 02/01/2023   CALCIUM  9.2 02/01/2023   EGFR 86 01/20/2021   GFRNONAA >60 02/01/2023   Lab Results  Component Value Date   CHOL 139 02/01/2023   HDL  63 02/01/2023   LDLCALC 68 02/01/2023   TRIG 39 02/01/2023   CHOLHDL 2.2 02/01/2023   Lab Results  Component Value Date   TSH 0.812 02/01/2023   Lab Results  Component Value Date   HGBA1C 6.7 (A) 06/26/2023   Lab Results  Component Value Date   WBC 6.1 02/01/2023   HGB 15.8 02/01/2023   HCT 45.4 02/01/2023   MCV 92.1 02/01/2023   PLT 172 02/01/2023   Lab Results  Component Value Date   ALT 28 02/01/2023   AST 26 02/01/2023   ALKPHOS 61 02/01/2023   BILITOT 0.7 02/01/2023   No results found for: MARIEN BOLLS, VD25OH   Patient Active Problem List   Diagnosis Date Noted   Special screening for malignant neoplasms, colon 03/20/2023   Polyp of sigmoid colon 03/20/2023   Type 1 diabetes mellitus with other specified complication (HCC) 09/02/2020   Carpal tunnel syndrome on both sides 04/10/2019   Hyperlipidemia LDL goal <100 09/26/2017   Thyroid  nodule 2017   ED (erectile dysfunction) 03/13/2013   OSA on CPAP 11/22/2011    No Known Allergies  Past Surgical History:  Procedure Laterality Date   COLONOSCOPY WITH PROPOFOL  N/A 03/20/2023   Procedure: COLONOSCOPY WITH PROPOFOL  WITH POLYPECTOMY;  Surgeon: Jinny Carmine, MD;  Location: [facility];  Service: Endoscopy;  Laterality: N/A;  Diabetic   CYST REMOVAL TRUNK  2000    Social History   Tobacco Use   Smoking status: Never   Smokeless tobacco: Never  Vaping Use   Vaping status: Never Used  Substance Use Topics  Alcohol use: Yes    Comment: rarely   Drug use: No     Medication list has been reviewed and updated.  Current Meds  Medication Sig   aspirin EC 81 MG tablet Take 81 mg by mouth daily.   atorvastatin  (LIPITOR) 10 MG tablet TAKE (1) TABLET BY MOUTH EVERY DAY   celecoxib (CELEBREX) 200 MG capsule Take 200 mg by mouth daily.   Continuous Glucose Transmitter (DEXCOM G6 TRANSMITTER) MISC USE DAILY AS DIRECTED   insulin  glargine (LANTUS  SOLOSTAR) 100 UNIT/ML Solostar Pen Inject  50 Units into the skin at bedtime.   insulin  lispro (HUMALOG  KWIKPEN) 100 UNIT/ML KwikPen ADMINISTER 34 UNITS UNDER THE SKIN THREE TIMES DAILY (Patient taking differently: 16-20 Units 3 (three) times daily.)   lisinopril  (ZESTRIL ) 5 MG tablet TAKE ONE (1) TABLET BY MOUTH ONCE DAILY   NON FORMULARY CPAP nightly @ 7 cm H2O   sildenafil  (REVATIO ) 20 MG tablet TAKE (1) TABLET BY MOUTH EVERY DAY AS NEEDED   [DISCONTINUED] Continuous Glucose Sensor (DEXCOM G7 SENSOR) MISC Place 1 each onto the skin continuous.       10/30/2023    8:10 AM 06/26/2023    8:58 AM 02/01/2023    8:08 AM 10/05/2022    4:08 PM  GAD 7 : Generalized Anxiety Score  Nervous, Anxious, on Edge 0 0 0 0  Control/stop worrying 0 0 0 0  Worry too much - different things 0 0 0 0  Trouble relaxing 0 0 0 0  Restless 0 0 0 0  Easily annoyed or irritable 0 0 0 0  Afraid - awful might happen 0 0 0 0  Total GAD 7 Score 0 0 0 0  Anxiety Difficulty Not difficult at all Not difficult at all Not difficult at all Not difficult at all       10/30/2023    8:10 AM 06/26/2023    8:58 AM 02/01/2023    8:08 AM  Depression screen PHQ 2/9  Decreased Interest 0 0 0  Down, Depressed, Hopeless 0 0 0  PHQ - 2 Score 0 0 0  Altered sleeping 0  0  Tired, decreased energy 0  0  Change in appetite 0  0  Feeling bad or failure about yourself  0  0  Trouble concentrating 0  0  Moving slowly or fidgety/restless 0  0  Suicidal thoughts 0  0  PHQ-9 Score 0  0  Difficult doing work/chores Not difficult at all  Not difficult at all    BP Readings from Last 3 Encounters:  10/30/23 124/70  06/26/23 116/78  03/20/23 115/75    Physical Exam Vitals and nursing note reviewed.  Constitutional:      General: He is not in acute distress.    Appearance: He is well-developed.  HENT:     Head: Normocephalic and atraumatic.  Neck:     Vascular: No carotid bruit.   Cardiovascular:     Rate and Rhythm: Normal rate and regular rhythm.     Heart sounds:  No murmur heard. Pulmonary:     Effort: Pulmonary effort is normal. No respiratory distress.     Breath sounds: No wheezing or rhonchi.   Musculoskeletal:     Cervical back: Normal range of motion.     Right lower leg: No edema.     Left lower leg: No edema.  Lymphadenopathy:     Cervical: No cervical adenopathy.   Skin:    General: Skin is warm and  dry.     Findings: No rash.   Neurological:     Mental Status: He is alert and oriented to person, place, and time.   Psychiatric:        Mood and Affect: Mood normal.        Behavior: Behavior normal.     Wt Readings from Last 3 Encounters:  10/30/23 253 lb (114.8 kg)  06/26/23 262 lb (118.8 kg)  03/20/23 253 lb (114.8 kg)    BP 124/70   Pulse 85   Ht 6' 1 (1.854 m)   Wt 253 lb (114.8 kg)   SpO2 98%   BMI 33.38 kg/m   Assessment and Plan:  Problem List Items Addressed This Visit       Unprioritized   Hyperlipidemia LDL goal <100 (Chronic)   On atorvastatin  without side effects.      Type 1 diabetes mellitus with other specified complication (HCC) - Primary (Chronic)   Blood sugars have been stable.  No recent hypoglycemic events requiring assistance. Currently medications are insulin  - basal bolus. Lab Results  Component Value Date   HGBA1C 6.7 (A) 06/26/2023   Last visit no changes were made.       Relevant Medications   Continuous Glucose Sensor (DEXCOM G7 SENSOR) MISC   Other Relevant Orders   Hemoglobin A1c   Other Visit Diagnoses       Palpitations       mostly night time without worrisome features will check CMP and EKG - NSR @ 77, WNL Advise hydration, limited caffeine   Relevant Orders   Comprehensive metabolic panel with GFR   EKG 87-Ozji (Completed)     Long-term insulin  use (HCC)           No follow-ups on file.    Leita HILARIO Adie, MD [facility] [place]

## 2023-10-30 NOTE — Assessment & Plan Note (Signed)
 On atorvastatin without side effects

## 2023-10-30 NOTE — Assessment & Plan Note (Signed)
 Blood sugars have been stable.  No recent hypoglycemic events requiring assistance. Currently medications are insulin  - basal bolus. Lab Results  Component Value Date   HGBA1C 6.7 (A) 06/26/2023   Last visit no changes were made.

## 2023-12-06 ENCOUNTER — Encounter: Payer: Self-pay | Admitting: Internal Medicine

## 2023-12-06 ENCOUNTER — Other Ambulatory Visit: Payer: Self-pay

## 2023-12-06 DIAGNOSIS — N529 Male erectile dysfunction, unspecified: Secondary | ICD-10-CM

## 2023-12-06 MED ORDER — SILDENAFIL CITRATE 20 MG PO TABS: 20.0000 mg | ORAL_TABLET | Freq: Every day | ORAL | 1 refills | Status: AC | PRN

## 2024-01-17 LAB — HM DIABETES EYE EXAM

## 2024-01-25 ENCOUNTER — Other Ambulatory Visit: Payer: Self-pay | Admitting: Internal Medicine

## 2024-01-25 DIAGNOSIS — E785 Hyperlipidemia, unspecified: Secondary | ICD-10-CM

## 2024-01-26 NOTE — Telephone Encounter (Signed)
 Requested Prescriptions  Pending Prescriptions Disp Refills   atorvastatin  (LIPITOR) 10 MG tablet [Pharmacy Med Name: ATORVASTATIN  10MG  TABLETS] 90 tablet 0    Sig: TAKE 1 TABLET BY MOUTH EVERY DAY     Cardiovascular:  Antilipid - Statins Failed - 01/26/2024 11:52 AM      Failed - Lipid Panel in normal range within the last 12 months    Cholesterol, Total  Date Value Ref Range Status  01/20/2021 125 100 - 199 mg/dL Final   Cholesterol  Date Value Ref Range Status  02/01/2023 139 0 - 200 mg/dL Final   LDL Chol Calc (NIH)  Date Value Ref Range Status  01/20/2021 54 0 - 99 mg/dL Final   LDL Cholesterol  Date Value Ref Range Status  02/01/2023 68 0 - 99 mg/dL Final    Comment:           Total Cholesterol/HDL:CHD Risk Coronary Heart Disease Risk Table                     Men   Women  1/2 Average Risk   3.4   3.3  Average Risk       5.0   4.4  2 X Average Risk   9.6   7.1  3 X Average Risk  23.4   11.0        Use the calculated Patient Ratio above and the CHD Risk Table to determine the patient's CHD Risk.        ATP III CLASSIFICATION (LDL):  <100     mg/dL   Optimal  899-870  mg/dL   Near or Above                    Optimal  130-159  mg/dL   Borderline  839-810  mg/dL   High  >809     mg/dL   Very High Performed at [facility], [address] [place], [place] [address]    HDL  Date Value Ref Range Status  02/01/2023 63 >40 mg/dL Final  90/78/7977 62 >60 mg/dL Final   Triglycerides  Date Value Ref Range Status  02/01/2023 39 <150 mg/dL Final         Passed - Patient is not pregnant      Passed - Valid encounter within last 12 months    Recent Outpatient Visits           2 months ago Type 1 diabetes mellitus with other specified complication (HCC)   [place] [facility] at [facility], Leita DEL, MD   7 months ago Type 1 diabetes mellitus with other specified complication [facility])   [place] [facility] at [facility], Leita DEL, MD       Future Appointments             In 1 week Just[place], Leita DEL, MD [facility] at [facility], [phone] [place]

## 2024-02-07 ENCOUNTER — Encounter: Payer: Self-pay | Admitting: Internal Medicine

## 2024-02-07 ENCOUNTER — Ambulatory Visit (INDEPENDENT_AMBULATORY_CARE_PROVIDER_SITE_OTHER): Payer: BC Managed Care – PPO | Admitting: Internal Medicine

## 2024-02-07 VITALS — BP 122/78 | HR 84 | Ht 73.0 in | Wt 255.0 lb

## 2024-02-07 DIAGNOSIS — E1069 Type 1 diabetes mellitus with other specified complication: Secondary | ICD-10-CM | POA: Diagnosis not present

## 2024-02-07 DIAGNOSIS — Z0000 Encounter for general adult medical examination without abnormal findings: Secondary | ICD-10-CM

## 2024-02-07 DIAGNOSIS — Z23 Encounter for immunization: Secondary | ICD-10-CM | POA: Diagnosis not present

## 2024-02-07 DIAGNOSIS — E785 Hyperlipidemia, unspecified: Secondary | ICD-10-CM | POA: Diagnosis not present

## 2024-02-07 DIAGNOSIS — E041 Nontoxic single thyroid nodule: Secondary | ICD-10-CM

## 2024-02-07 DIAGNOSIS — N529 Male erectile dysfunction, unspecified: Secondary | ICD-10-CM

## 2024-02-07 DIAGNOSIS — Z794 Long term (current) use of insulin: Secondary | ICD-10-CM

## 2024-02-07 DIAGNOSIS — Z125 Encounter for screening for malignant neoplasm of prostate: Secondary | ICD-10-CM

## 2024-02-07 DIAGNOSIS — G4733 Obstructive sleep apnea (adult) (pediatric): Secondary | ICD-10-CM

## 2024-02-07 NOTE — Assessment & Plan Note (Signed)
 Will continue to monitor thyroid  functions

## 2024-02-07 NOTE — Assessment & Plan Note (Signed)
 LDL is  Lab Results  Component Value Date   LDLCALC 68 02/01/2023    Current medication regimen is atorvastatin . Goal LDL is < 100.

## 2024-02-07 NOTE — Assessment & Plan Note (Signed)
 Using CPAP nightly with benefit, improved sleep and decreased daytime somnolence.

## 2024-02-07 NOTE — Progress Notes (Signed)
 Date:  02/07/2024   Name:  Darius Morrison   DOB:  Nov 07, 1975   MRN:  [id number]   Chief Complaint: Annual Exam Darius Morrison is a 48 y.o. male who presents today for his Complete Annual Exam. He feels well. He reports exercising - walking. He reports he is sleeping well.   Health Maintenance  Topic Date Due   Hepatitis B Vaccine (1 of 3 - 19+ 3-dose series) Never done   Pneumococcal Vaccine for age over 81 (2 of 2 - PCV) 03/14/2015   Zoster (Shingles) Vaccine (1 of 2) Never done   Yearly kidney health urinalysis for diabetes  02/01/2024   HIV Screening  08/14/2024*   Hemoglobin A1C  04/30/2024   Complete foot exam   08/13/2024   Yearly kidney function blood test for diabetes  10/29/2024   Eye exam for diabetics  01/16/2025   DTaP/Tdap/Td vaccine (3 - Td or Tdap) 10/04/2032   Colon Cancer Screening  03/19/2033   Flu Shot  Completed   Hepatitis C Screening  Completed   HPV Vaccine  Aged Out   Meningitis B Vaccine  Aged Out   COVID-19 Vaccine  Discontinued  *Topic was postponed. The date shown is not the original due date.   Lab Results  Component Value Date   PSA1 0.5 01/20/2021   PSA1 0.7 08/15/2019    Diabetes He presents for his follow-up diabetic visit. He has type 1 diabetes mellitus. His disease course has been stable. Pertinent negatives for hypoglycemia include no dizziness, headaches or nervousness/anxiousness. Pertinent negatives for diabetes include no chest pain, no fatigue and no weakness. Symptoms are stable. Current diabetic treatment includes intensive insulin  program. He is compliant with treatment all of the time.  Hyperlipidemia This is a chronic problem. The problem is controlled. Pertinent negatives include no chest pain, myalgias or shortness of breath. Current antihyperlipidemic treatment includes statins.    Review of Systems  Constitutional:  Negative for fatigue and unexpected weight change.  HENT:  Negative for nosebleeds and trouble  swallowing.   Eyes:  Negative for visual disturbance.  Respiratory:  Negative for cough, chest tightness, shortness of breath and wheezing.   Cardiovascular:  Negative for chest pain, palpitations and leg swelling.  Gastrointestinal:  Negative for abdominal pain, constipation and diarrhea.  Musculoskeletal:  Negative for arthralgias and myalgias.  Skin:  Negative for rash.  Neurological:  Negative for dizziness, weakness, light-headedness and headaches.  Psychiatric/Behavioral:  Negative for dysphoric mood and sleep disturbance. The patient is not nervous/anxious.      Lab Results  Component Value Date   NA 135 10/30/2023   K 4.7 10/30/2023   CO2 27 10/30/2023   GLUCOSE 82 10/30/2023   BUN 18 10/30/2023   CREATININE 1.21 10/30/2023   CALCIUM  9.7 10/30/2023   EGFR 86 01/20/2021   GFRNONAA >60 10/30/2023   Lab Results  Component Value Date   CHOL 139 02/01/2023   HDL 63 02/01/2023   LDLCALC 68 02/01/2023   TRIG 39 02/01/2023   CHOLHDL 2.2 02/01/2023   Lab Results  Component Value Date   TSH 0.812 02/01/2023   Lab Results  Component Value Date   HGBA1C 6.3 (H) 10/30/2023   Lab Results  Component Value Date   WBC 6.1 02/01/2023   HGB 15.8 02/01/2023   HCT 45.4 02/01/2023   MCV 92.1 02/01/2023   PLT 172 02/01/2023   Lab Results  Component Value Date   ALT 21 10/30/2023   AST  21 10/30/2023   ALKPHOS 61 10/30/2023   BILITOT 0.6 10/30/2023   No results found for: MARIEN BOLLS, VD25OH   Patient Active Problem List   Diagnosis Date Noted   Special screening for malignant neoplasms, colon 03/20/2023   Polyp of sigmoid colon 03/20/2023   Type 1 diabetes mellitus with other specified complication (HCC) 09/02/2020   Carpal tunnel syndrome on both sides 04/10/2019   Hyperlipidemia LDL goal <100 09/26/2017   Thyroid  nodule 2017   ED (erectile dysfunction) 03/13/2013   OSA on CPAP 11/22/2011    No Known Allergies  Past Surgical History:  Procedure  Laterality Date   COLONOSCOPY WITH PROPOFOL  N/A 03/20/2023   Procedure: COLONOSCOPY WITH PROPOFOL  WITH POLYPECTOMY;  Surgeon: Jinny Carmine, MD;  Location: [facility];  Service: Endoscopy;  Laterality: N/A;  Diabetic   CYST REMOVAL TRUNK  2000    Social History   Tobacco Use   Smoking status: Never   Smokeless tobacco: Never  Vaping Use   Vaping status: Never Used  Substance Use Topics   Alcohol use: Yes    Comment: rarely   Drug use: No     Medication list has been reviewed and updated.  Current Meds  Medication Sig   aspirin EC 81 MG tablet Take 81 mg by mouth daily.   atorvastatin  (LIPITOR) 10 MG tablet TAKE 1 TABLET BY MOUTH EVERY DAY   celecoxib (CELEBREX) 200 MG capsule Take 200 mg by mouth daily.   Continuous Glucose Sensor (DEXCOM G7 SENSOR) MISC Place 1 each onto the skin continuous.   Continuous Glucose Transmitter (DEXCOM G6 TRANSMITTER) MISC USE DAILY AS DIRECTED   insulin  glargine (LANTUS  SOLOSTAR) 100 UNIT/ML Solostar Pen Inject 50 Units into the skin at bedtime.   insulin  lispro (HUMALOG  KWIKPEN) 100 UNIT/ML KwikPen ADMINISTER 34 UNITS UNDER THE SKIN THREE TIMES DAILY (Patient taking differently: 16-20 Units 3 (three) times daily.)   lisinopril  (ZESTRIL ) 5 MG tablet TAKE ONE (1) TABLET BY MOUTH ONCE DAILY   NON FORMULARY CPAP nightly @ 7 cm H2O   sildenafil  (REVATIO ) 20 MG tablet Take 1 tablet (20 mg total) by mouth daily as needed for up to 1 dose.       02/07/2024    7:50 AM 10/30/2023    8:10 AM 06/26/2023    8:58 AM 02/01/2023    8:08 AM  GAD 7 : Generalized Anxiety Score  Nervous, Anxious, on Edge 0 0 0 0  Control/stop worrying 0 0 0 0  Worry too much - different things 0 0 0 0  Trouble relaxing 0 0 0 0  Restless 0 0 0 0  Easily annoyed or irritable 0 0 0 0  Afraid - awful might happen 0 0 0 0  Total GAD 7 Score 0 0 0 0  Anxiety Difficulty Not difficult at all Not difficult at all Not difficult at all Not difficult at all       02/07/2024     7:50 AM 10/30/2023    8:10 AM 06/26/2023    8:58 AM  Depression screen PHQ 2/9  Decreased Interest 0 0 0  Down, Depressed, Hopeless 0 0 0  PHQ - 2 Score 0 0 0  Altered sleeping 0 0   Tired, decreased energy 0 0   Change in appetite 0 0   Feeling bad or failure about yourself  0 0   Trouble concentrating 0 0   Moving slowly or fidgety/restless 0 0   Suicidal thoughts 0 0  PHQ-9 Score 0 0   Difficult doing work/chores Not difficult at all Not difficult at all     BP Readings from Last 3 Encounters:  02/07/24 122/78  10/30/23 124/70  06/26/23 116/78    Physical Exam Vitals and nursing note reviewed.  Constitutional:      Appearance: Normal appearance. He is well-developed.  HENT:     Head: Normocephalic.     Right Ear: Tympanic membrane, ear canal and external ear normal.     Left Ear: Tympanic membrane, ear canal and external ear normal.     Nose: Nose normal.  Eyes:     Conjunctiva/sclera: Conjunctivae normal.     Pupils: Pupils are equal, round, and reactive to light.  Neck:     Thyroid : No thyromegaly.     Vascular: No carotid bruit.  Cardiovascular:     Rate and Rhythm: Normal rate and regular rhythm.     Heart sounds: Normal heart sounds.  Pulmonary:     Effort: Pulmonary effort is normal.     Breath sounds: Normal breath sounds. No wheezing.  Chest:  Breasts:    Right: No mass.     Left: No mass.  Abdominal:     General: Bowel sounds are normal.     Palpations: Abdomen is soft.     Tenderness: There is no abdominal tenderness.  Musculoskeletal:        General: Normal range of motion.     Cervical back: Normal range of motion and neck supple.  Lymphadenopathy:     Cervical: No cervical adenopathy.  Skin:    General: Skin is warm and dry.     Capillary Refill: Capillary refill takes less than 2 seconds.  Neurological:     General: No focal deficit present.     Mental Status: He is alert and oriented to person, place, and time.     Deep Tendon  Reflexes: Reflexes are normal and symmetric.  Psychiatric:        Attention and Perception: Attention normal.        Mood and Affect: Mood normal.        Thought Content: Thought content normal.    Diabetic Foot Exam - Simple   No data filed      Wt Readings from Last 3 Encounters:  02/07/24 255 lb (115.7 kg)  10/30/23 253 lb (114.8 kg)  06/26/23 262 lb (118.8 kg)    BP 122/78   Pulse 84   Ht 6' 1 (1.854 m)   Wt 255 lb (115.7 kg)   SpO2 97%   BMI 33.64 kg/m   Assessment and Plan:  Problem List Items Addressed This Visit       Unprioritized   Hyperlipidemia LDL goal <100 (Chronic)   LDL is  Lab Results  Component Value Date   LDLCALC 68 02/01/2023    Current medication regimen is atorvastatin . Goal LDL is < 100.       Relevant Orders   Lipid panel   OSA on CPAP (Chronic)   Using CPAP nightly with benefit, improved sleep and decreased daytime somnolence.      Relevant Orders   CBC with Differential/Platelet   Thyroid  nodule (Chronic)   Will continue to monitor thyroid  functions      Relevant Orders   TSH   Type 1 diabetes mellitus with other specified complication (HCC) (Chronic)   Currently medications are insulin .  No hypoglycemic episodes noted. Home blood sugars in the 130 average range. Last visit medical  regimen changes were to reduce overall insulin  dose - Lantus  reduced to 26 u and 8-12 u humalog . Lab Results  Component Value Date   HGBA1C 6.3 (H) 10/30/2023          Relevant Orders   Comprehensive metabolic panel with GFR   Hemoglobin A1c   Microalbumin / creatinine urine ratio   Other Visit Diagnoses       Annual physical exam    -  Primary   up to date on screening and immunizations.   Relevant Orders   CBC with Differential/Platelet   Comprehensive metabolic panel with GFR   Hemoglobin A1c   Lipid panel   Microalbumin / creatinine urine ratio   TSH     Prostate cancer screening       Relevant Orders   PSA     ED  (erectile dysfunction) of organic origin         Long-term insulin  use (HCC)         Encounter for immunization       Relevant Orders   Flu vaccine trivalent PF, 6mos and older(Flulaval,Afluria,Fluarix,Fluzone) (Completed)       Return in about 4 months (around 06/09/2024) for [facility]  Dr. Sol and shingrix #2.    Leita HILARIO Adie, MD [facility] [place]

## 2024-02-07 NOTE — Assessment & Plan Note (Addendum)
 Currently medications are insulin .  No hypoglycemic episodes noted. Home blood sugars in the 130 average range. Last visit medical regimen changes were to reduce overall insulin  dose - Lantus  reduced to 26 u and 8-12 u humalog . Lab Results  Component Value Date   HGBA1C 6.3 (H) 10/30/2023

## 2024-02-08 ENCOUNTER — Ambulatory Visit: Payer: Self-pay | Admitting: Internal Medicine

## 2024-02-08 LAB — CBC WITH DIFFERENTIAL/PLATELET
Basophils Absolute: 0 x10E3/uL (ref 0.0–0.2)
Basos: 1 %
EOS (ABSOLUTE): 0.2 x10E3/uL (ref 0.0–0.4)
Eos: 3 %
Hematocrit: 49.6 % (ref 37.5–51.0)
Hemoglobin: 16.2 g/dL (ref 13.0–17.7)
Immature Grans (Abs): 0 x10E3/uL (ref 0.0–0.1)
Immature Granulocytes: 0 %
Lymphocytes Absolute: 2 x10E3/uL (ref 0.7–3.1)
Lymphs: 34 %
MCH: 30.9 pg (ref 26.6–33.0)
MCHC: 32.7 g/dL (ref 31.5–35.7)
MCV: 95 fL (ref 79–97)
Monocytes Absolute: 0.6 x10E3/uL (ref 0.1–0.9)
Monocytes: 10 %
Neutrophils Absolute: 3.1 x10E3/uL (ref 1.4–7.0)
Neutrophils: 52 %
Platelets: 167 x10E3/uL (ref 150–450)
RBC: 5.24 x10E6/uL (ref 4.14–5.80)
RDW: 12.3 % (ref 11.6–15.4)
WBC: 5.9 x10E3/uL (ref 3.4–10.8)

## 2024-02-08 LAB — COMPREHENSIVE METABOLIC PANEL WITH GFR
ALT: 18 IU/L (ref 0–44)
AST: 24 IU/L (ref 0–40)
Albumin: 4.4 g/dL (ref 4.1–5.1)
Alkaline Phosphatase: 78 IU/L (ref 47–123)
BUN/Creatinine Ratio: 14 (ref 9–20)
BUN: 17 mg/dL (ref 6–24)
Bilirubin Total: 0.4 mg/dL (ref 0.0–1.2)
CO2: 24 mmol/L (ref 20–29)
Calcium: 9.9 mg/dL (ref 8.7–10.2)
Chloride: 104 mmol/L (ref 96–106)
Creatinine, Ser: 1.19 mg/dL (ref 0.76–1.27)
Globulin, Total: 2.6 g/dL (ref 1.5–4.5)
Glucose: 109 mg/dL — ABNORMAL HIGH (ref 70–99)
Potassium: 5.2 mmol/L (ref 3.5–5.2)
Sodium: 140 mmol/L (ref 134–144)
Total Protein: 7 g/dL (ref 6.0–8.5)
eGFR: 74 mL/min/1.73 (ref >59)

## 2024-02-08 LAB — LIPID PANEL
Chol/HDL Ratio: 2.3 ratio (ref 0.0–5.0)
Cholesterol, Total: 118 mg/dL (ref 100–199)
HDL: 52 mg/dL (ref >39)
LDL Chol Calc (NIH): 54 mg/dL (ref 0–99)
Triglycerides: 52 mg/dL (ref 0–149)
VLDL Cholesterol Cal: 12 mg/dL (ref 5–40)

## 2024-02-08 LAB — HEMOGLOBIN A1C
Est. average glucose Bld gHb Est-mCnc: 134 mg/dL
Hgb A1c MFr Bld: 6.3 % — ABNORMAL HIGH (ref 4.8–5.6)

## 2024-02-08 LAB — TSH: TSH: 0.993 u[IU]/mL (ref 0.450–4.500)

## 2024-02-08 LAB — PSA: Prostate Specific Ag, Serum: 0.5 ng/mL (ref 0.0–4.0)

## 2024-02-08 LAB — MICROALBUMIN / CREATININE URINE RATIO
Creatinine, Urine: 153.4 mg/dL
Microalb/Creat Ratio: <2 mg/g{creat} (ref 0–29)
Microalbumin, Urine: <3 ug/mL

## 2024-02-09 ENCOUNTER — Encounter: Payer: Self-pay | Admitting: Internal Medicine

## 2024-02-09 ENCOUNTER — Ambulatory Visit

## 2024-02-09 DIAGNOSIS — Z23 Encounter for immunization: Secondary | ICD-10-CM

## 2024-02-09 NOTE — Progress Notes (Signed)
 Patient is in office today for a nurse visit for Immunization. Patient Injection was given in the  Right deltoid. Patient tolerated injection well.  Routed to Dr Sol since Dr Justus is out of the office.  Darius Morrison  Primary [facility] [place] CMA [facility] [address]  [place] [address] Office [phone]  Fax: [phone]

## 2024-02-09 NOTE — Progress Notes (Unsigned)
 SABRA

## 2024-04-26 ENCOUNTER — Other Ambulatory Visit: Payer: Self-pay | Admitting: Internal Medicine

## 2024-04-26 DIAGNOSIS — E785 Hyperlipidemia, unspecified: Secondary | ICD-10-CM

## 2024-04-28 ENCOUNTER — Other Ambulatory Visit: Payer: Self-pay | Admitting: Internal Medicine

## 2024-04-28 DIAGNOSIS — E1069 Type 1 diabetes mellitus with other specified complication: Secondary | ICD-10-CM

## 2024-04-29 NOTE — Telephone Encounter (Signed)
 Requested Prescriptions  Pending Prescriptions Disp Refills   atorvastatin  (LIPITOR) 10 MG tablet [Pharmacy Med Name: ATORVASTATIN  10MG  TABLETS] 90 tablet 0    Sig: TAKE 1 TABLET BY MOUTH EVERY DAY     Cardiovascular:  Antilipid - Statins Failed - 04/29/2024 12:57 PM      Failed - Lipid Panel in normal range within the last 12 months    Cholesterol, Total  Date Value Ref Range Status  02/07/2024 118 100 - 199 mg/dL Final   LDL Chol Calc (NIH)  Date Value Ref Range Status  02/07/2024 54 0 - 99 mg/dL Final   HDL  Date Value Ref Range Status  02/07/2024 52 >39 mg/dL Final   Triglycerides  Date Value Ref Range Status  02/07/2024 52 0 - 149 mg/dL Final         Passed - Patient is not pregnant      Passed - Valid encounter within last 12 months    Recent Outpatient Visits           2 months ago Annual physical exam   [place] [facility] at [facility], Leita DEL, MD   6 months ago Type 1 diabetes mellitus with other specified complication [facility])   [place] [facility] at [facility], Leita DEL, MD   10 months ago Type 1 diabetes mellitus with other specified complication [facility])   [place] [facility] at [facility], Leita DEL, MD

## 2024-04-30 NOTE — Telephone Encounter (Signed)
 Too soon for refill, last refill 06/26/23 for 90 and 3 refills.  Requested Prescriptions  Pending Prescriptions Disp Refills   lisinopril  (ZESTRIL ) 5 MG tablet [Pharmacy Med Name: LISINOPRIL  5MG  TABLETS] 90 tablet 3    Sig: TAKE 1 TABLET BY MOUTH DAILY     Cardiovascular:  ACE Inhibitors Passed - 04/30/2024 11:30 AM      Passed - Cr in normal range and within 180 days    Creatinine, Ser  Date Value Ref Range Status  02/07/2024 1.19 0.76 - 1.27 mg/dL Final         Passed - K in normal range and within 180 days    Potassium  Date Value Ref Range Status  02/07/2024 5.2 3.5 - 5.2 mmol/L Final         Passed - Patient is not pregnant      Passed - Last BP in normal range    BP Readings from Last 1 Encounters:  02/07/24 122/78         Passed - Valid encounter within last 6 months    Recent Outpatient Visits           2 months ago Annual physical exam   [place] [facility] at [facility], Leita DEL, MD   6 months ago Type 1 diabetes mellitus with other specified complication [facility])   [place] [facility] at [facility] [place], Leita DEL, MD   10 months ago Type 1 diabetes mellitus with other specified complication [facility])   [place] [facility] at [facility], Leita DEL, MD

## 2024-05-14 ENCOUNTER — Encounter: Payer: Self-pay | Admitting: Family Medicine

## 2024-05-15 ENCOUNTER — Other Ambulatory Visit: Payer: Self-pay

## 2024-05-15 ENCOUNTER — Telehealth: Payer: Self-pay | Admitting: Pharmacy Technician

## 2024-05-15 ENCOUNTER — Other Ambulatory Visit (HOSPITAL_COMMUNITY): Payer: Self-pay

## 2024-05-15 DIAGNOSIS — E1069 Type 1 diabetes mellitus with other specified complication: Secondary | ICD-10-CM

## 2024-05-15 MED ORDER — DEXCOM G7 SENSOR MISC: 1.0000 | 0 refills | Status: AC

## 2024-05-15 NOTE — Telephone Encounter (Signed)
 Please complete PA.    KP

## 2024-05-15 NOTE — Telephone Encounter (Signed)
 Pharmacy Patient Advocate Encounter   Received notification from Patient Advice Request messages that prior authorization for Dexcom G7 Sensor is required/requested.   Insurance verification completed.   The patient is insured through [facility].   Per test claim: PA required; PA started via CoverMyMeds. KEY [id number] . Waiting for clinical questions to populate.

## 2024-05-15 NOTE — Telephone Encounter (Signed)
 PA request has been Started. New Encounter has been or will be created for follow up. For additional info see Pharmacy Prior Auth telephone encounter from 05/15/24.

## 2024-05-15 NOTE — Telephone Encounter (Signed)
 Pharmacy Patient Advocate Encounter  Received notification from [employer] that Prior Authorization for Dexcom G7 Sensor has been APPROVED from 05/15/24 to 05/15/25. Ran test claim, Copay is $150.00 for 3 months. This test claim was processed through [facility]- copay amounts may vary at other pharmacies due to pharmacy/plan contracts, or as the patient moves through the different stages of their insurance plan.   PA #/Case ID/Reference #: [id number]

## 2024-05-16 ENCOUNTER — Telehealth: Payer: Self-pay

## 2024-05-16 NOTE — Telephone Encounter (Signed)
 Copied from CRM #[id number]. Topic: General - Other >> May 16, 2024  9:14 AM Joesph NOVAK wrote: Reason for CRM: Prior authorization dept is calling to advise that the decom sensor has been approved. 05/15/24-05/16/23 .   Case ID: [id number].

## 2024-06-12 ENCOUNTER — Encounter: Admitting: Family Medicine

## 2024-06-14 ENCOUNTER — Ambulatory Visit: Admitting: Family Medicine
